# Patient Record
Sex: Female | Born: 1950 | Race: White | Hispanic: No | Marital: Married | Smoking: Former smoker
Health system: Southern US, Community
[De-identification: ages and names within clinical notes are randomized; demographics above are authoritative.]

## PROBLEM LIST (undated history)

## (undated) DIAGNOSIS — N816 Rectocele: Secondary | ICD-10-CM

## (undated) DIAGNOSIS — K5909 Other constipation: Secondary | ICD-10-CM

## (undated) DIAGNOSIS — K219 Gastro-esophageal reflux disease without esophagitis: Secondary | ICD-10-CM

## (undated) DIAGNOSIS — R7303 Prediabetes: Secondary | ICD-10-CM

## (undated) DIAGNOSIS — A048 Other specified bacterial intestinal infections: Secondary | ICD-10-CM

## (undated) HISTORY — PX: STRABISMUS SURGERY: SHX218

## (undated) HISTORY — PX: CATARACT EXTRACTION, BILATERAL: SHX1313

## (undated) HISTORY — DX: Other specified bacterial intestinal infections: A04.8

---

## 1999-02-15 HISTORY — PX: TOTAL ABDOMINAL HYSTERECTOMY W/ BILATERAL SALPINGOOPHORECTOMY: SHX83

## 2003-03-25 HISTORY — PX: CYSTOCELE REPAIR: SHX163

## 2011-08-01 ENCOUNTER — Other Ambulatory Visit: Payer: Self-pay | Admitting: Specialist

## 2011-08-01 DIAGNOSIS — M8580 Other specified disorders of bone density and structure, unspecified site: Secondary | ICD-10-CM

## 2011-08-01 DIAGNOSIS — Z1231 Encounter for screening mammogram for malignant neoplasm of breast: Secondary | ICD-10-CM

## 2011-10-17 ENCOUNTER — Ambulatory Visit
Admission: RE | Admit: 2011-10-17 | Discharge: 2011-10-17 | Disposition: A | Discharge status: Home / Self Care | Payer: BC Managed Care – PPO | Source: Ambulatory Visit | Attending: Specialist | Admitting: Specialist

## 2011-10-17 DIAGNOSIS — Z1231 Encounter for screening mammogram for malignant neoplasm of breast: Secondary | ICD-10-CM

## 2011-10-17 DIAGNOSIS — M8580 Other specified disorders of bone density and structure, unspecified site: Secondary | ICD-10-CM

## 2013-10-14 DIAGNOSIS — Z8619 Personal history of other infectious and parasitic diseases: Secondary | ICD-10-CM

## 2013-10-14 HISTORY — DX: Personal history of other infectious and parasitic diseases: Z86.19

## 2013-10-14 HISTORY — PX: ESOPHAGOGASTRODUODENOSCOPY: SHX1529

## 2013-12-02 IMAGING — MG MM DIGITAL SCREENING BILAT W/ CAD
4 series · 4 of 4 positions shown · non-contrast
Comparison: none

CLINICAL DATA: Screening.

MAMMOGRAPHIC BILATERAL DIGITAL SCREENING WITH CAD

[R CC]
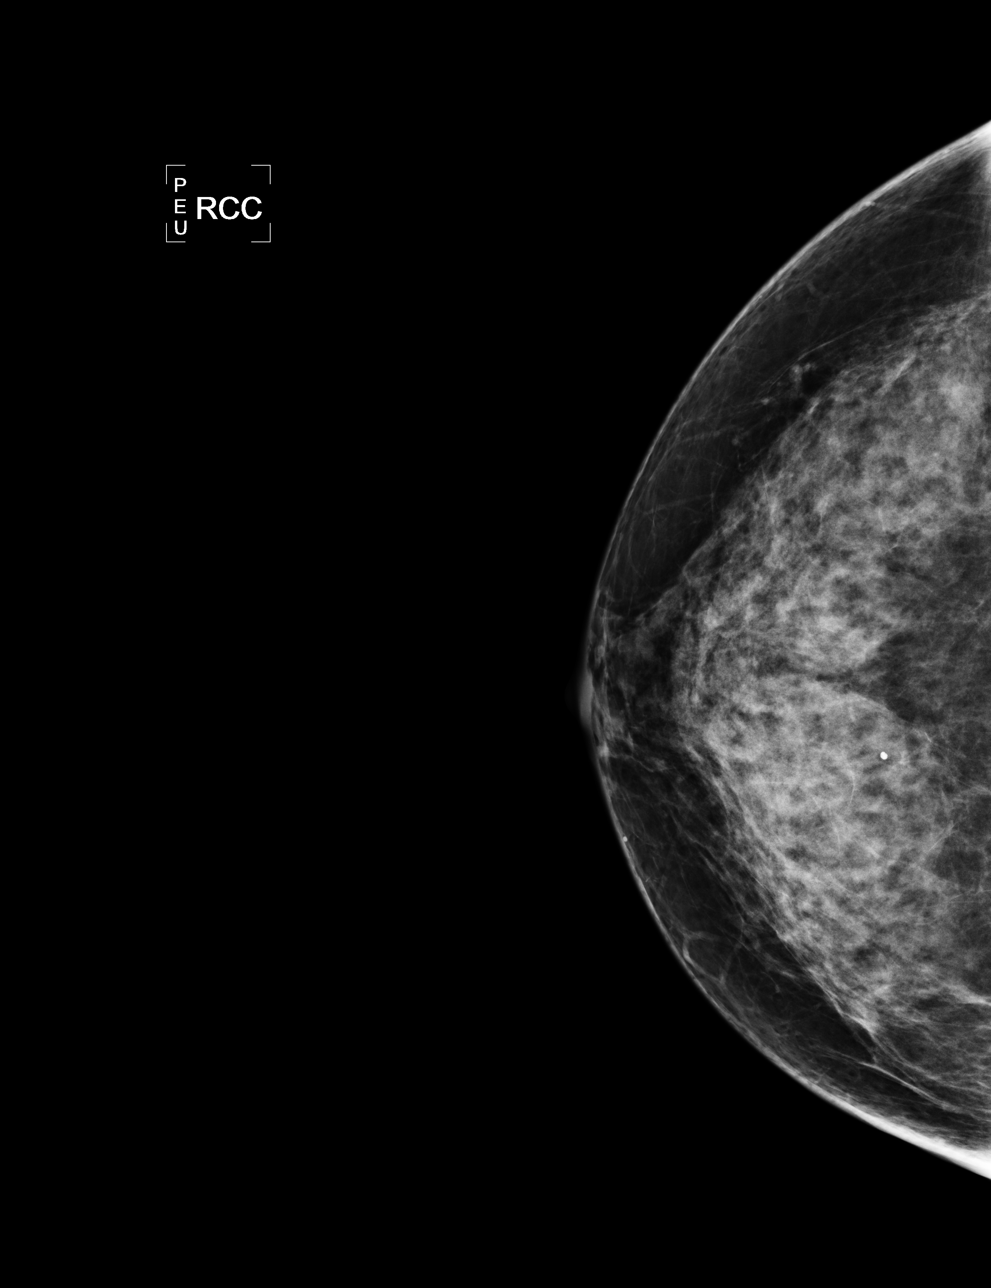

[L CC]
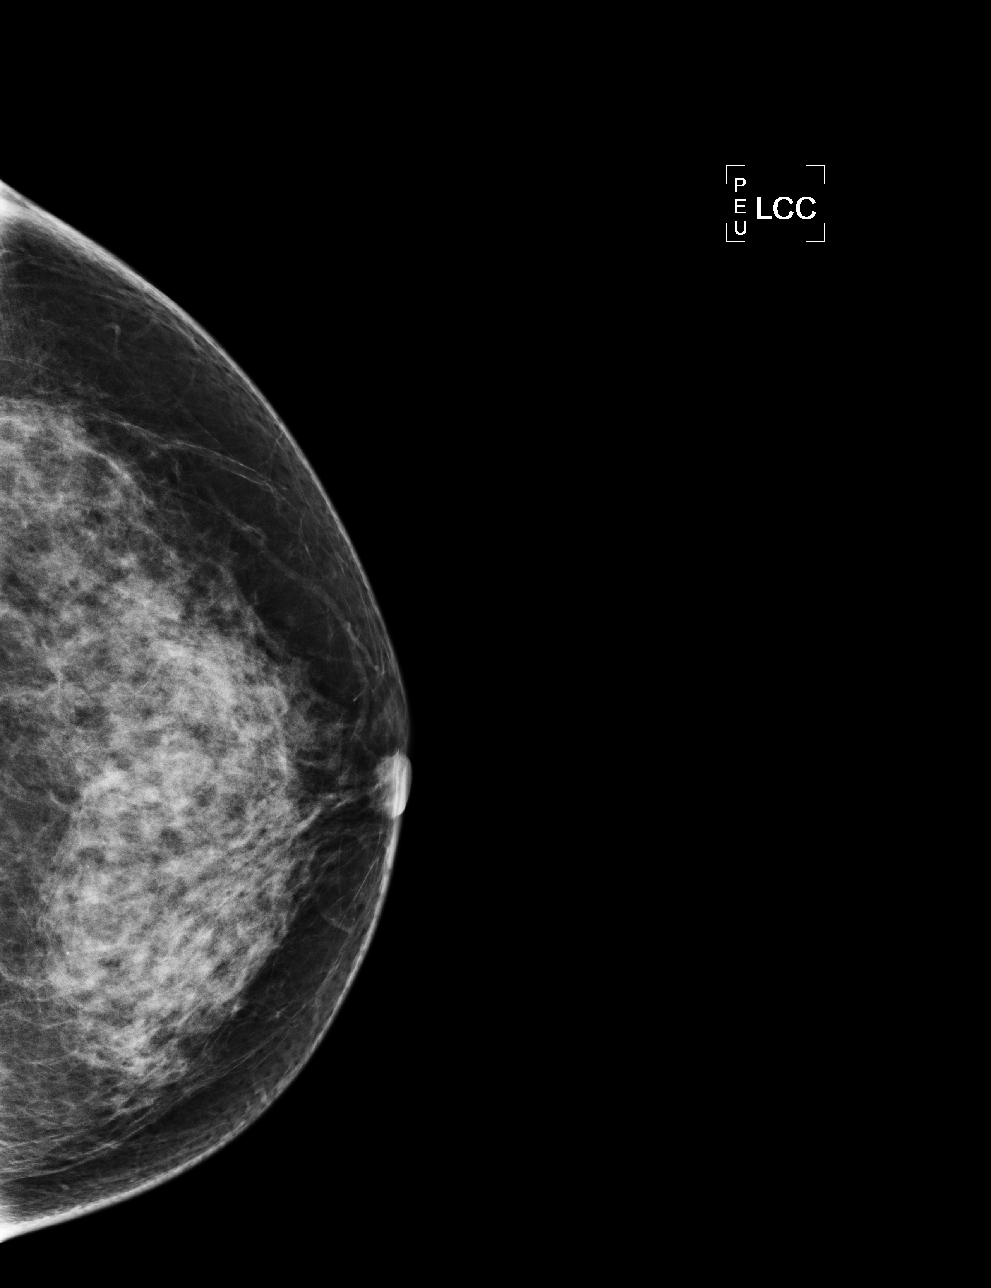

[L MLO]
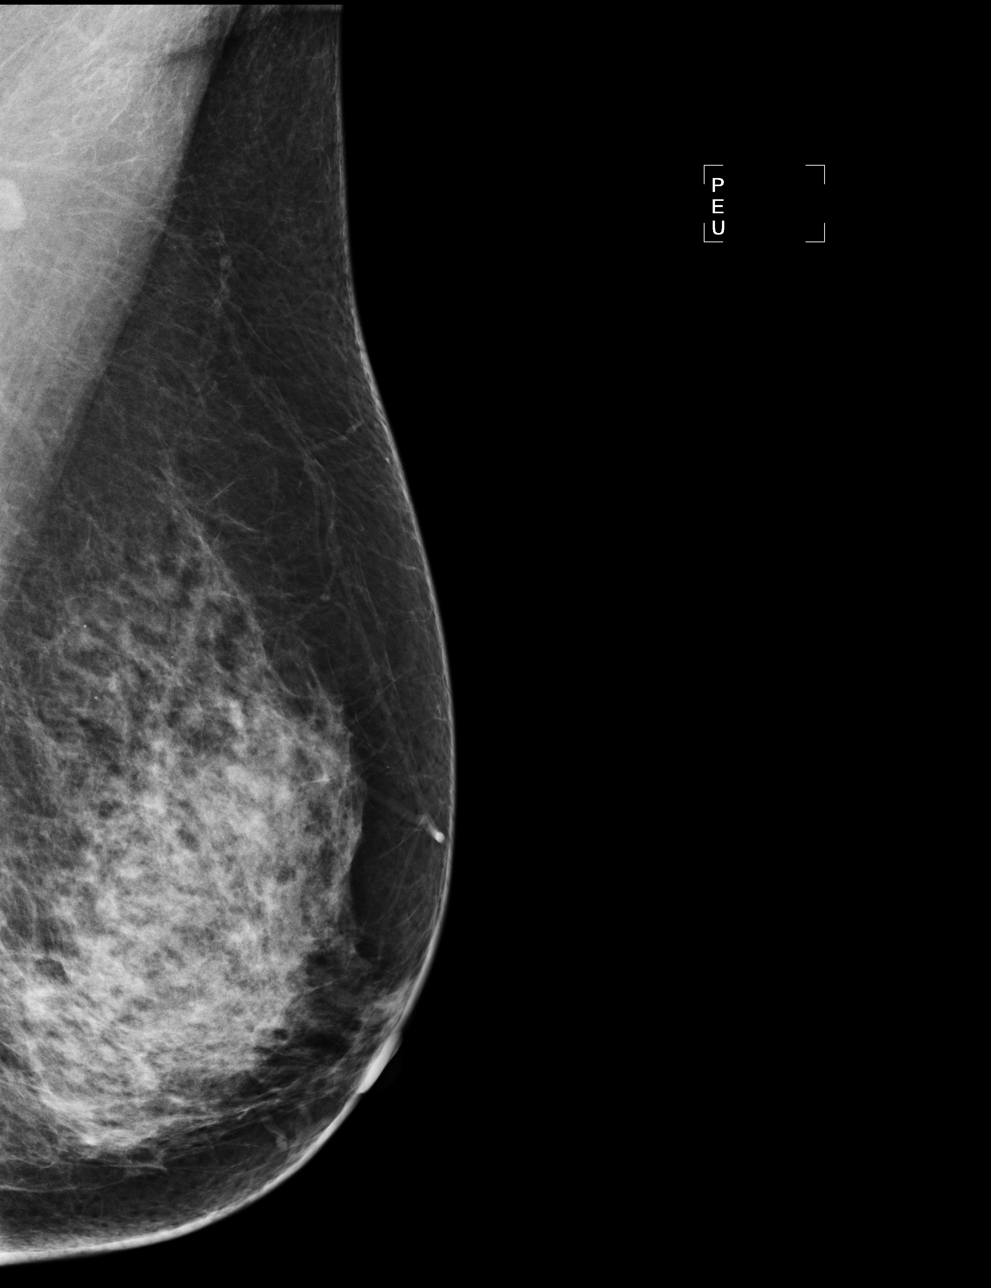

[R MLO]
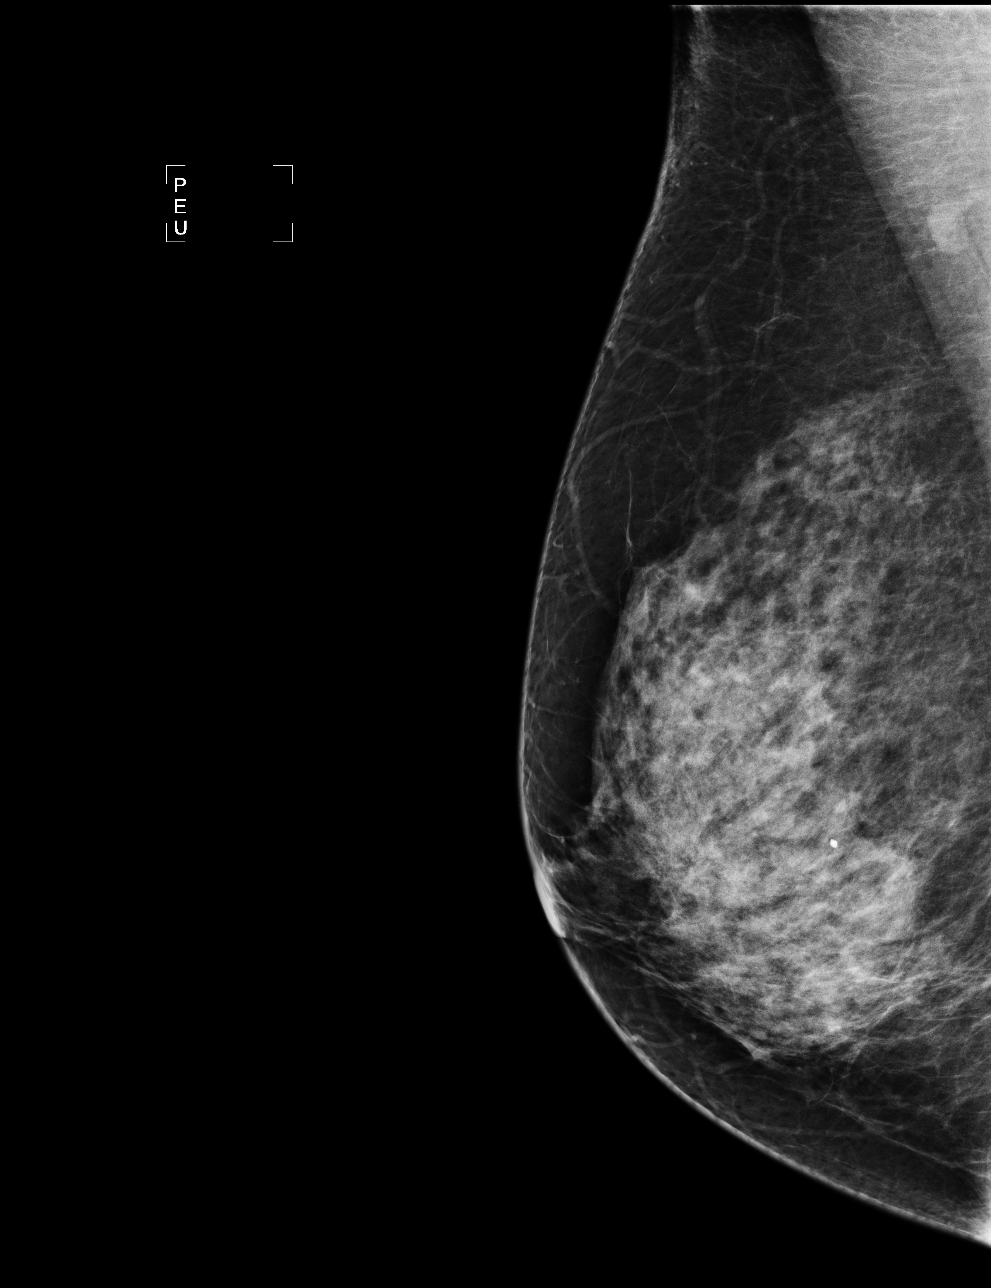

[4 of 4 positions shown; findings below may reference images not displayed]

FINDINGS: The breast tissue is heterogeneously dense.  No masses or
malignant type calcifications are identified.  Compared with prior
studies.

Images were processed with CAD.
IMPRESSION: No specific mammographic evidence of malignancy.

A result letter of this screening mammogram will be mailed directly
to the patient.

RECOMMENDATION:
Screening mammogram in one year. (Code:6P-7-BUE)

BI-RADS CATEGORY 1:  Negative

## 2015-05-17 HISTORY — PX: CATARACT EXTRACTION W/ INTRAOCULAR LENS IMPLANT: SHX1309

## 2016-02-15 ENCOUNTER — Encounter (HOSPITAL_BASED_OUTPATIENT_CLINIC_OR_DEPARTMENT_OTHER): Payer: Self-pay | Admitting: *Deleted

## 2016-02-16 ENCOUNTER — Ambulatory Visit: Payer: Self-pay | Admitting: Ophthalmology

## 2016-02-16 NOTE — H&P (Signed)
Date of examination:  02-11-16  Indication for surgery: to straighten the eyes and allow some binocularity  Pertinent past medical history:  Past Medical History:  Diagnosis Date  . GERD (gastroesophageal reflux disease)     Pertinent ocular history:  S/p strabismus surgery x 3:  Initially for esotropia, then for recurrent esotropia, then in 1989 RLR recess 6 mm adjustable for consecutive XT  Pertinent family history: No family history on file.  General:  Healthy appearing patient in no distress.    Eyes:    Acuity [place]  OD 20/30  OS 20/20-  External: Within normal limits     Anterior segment: healed conj scars OD   Motility:   XT to 30, sl larger in L gaze.  XT'=30.  RHoT 4 in primary, increasing in upgaze.  2- lim of elevation OD and 2- lim of adduction OD  Fundus: deferred  Refraction: SE plano OU approx   Heart: Regular rate and rhythm without murmur     Lungs: Clear to auscultation     Abdomen: Soft, nontender, normal bowel sounds     Impression:Exotropia, recurrent consecutive  Plan: 1) Explore and advance right medial rectus muscle  2) Explore and re-recess right lateral rectus muscle, adjustable  YOUNG,WILLIAM O

## 2016-02-19 ENCOUNTER — Ambulatory Visit (HOSPITAL_BASED_OUTPATIENT_CLINIC_OR_DEPARTMENT_OTHER): Payer: Medicare Other | Admitting: Certified Registered"

## 2016-02-19 ENCOUNTER — Encounter (HOSPITAL_BASED_OUTPATIENT_CLINIC_OR_DEPARTMENT_OTHER)
Admission: RE | Disposition: A | Discharge status: Home / Self Care | Payer: Self-pay | Source: Ambulatory Visit | Attending: Ophthalmology

## 2016-02-19 ENCOUNTER — Encounter (HOSPITAL_BASED_OUTPATIENT_CLINIC_OR_DEPARTMENT_OTHER): Payer: Self-pay | Admitting: *Deleted

## 2016-02-19 ENCOUNTER — Ambulatory Visit (HOSPITAL_BASED_OUTPATIENT_CLINIC_OR_DEPARTMENT_OTHER)
Admission: RE | Admit: 2016-02-19 | Discharge: 2016-02-19 | Disposition: A | Discharge status: Home / Self Care | Payer: Medicare Other | Source: Ambulatory Visit | Attending: Ophthalmology | Admitting: Ophthalmology

## 2016-02-19 DIAGNOSIS — K219 Gastro-esophageal reflux disease without esophagitis: Secondary | ICD-10-CM | POA: Diagnosis not present

## 2016-02-19 DIAGNOSIS — H5010 Unspecified exotropia: Secondary | ICD-10-CM | POA: Diagnosis not present

## 2016-02-19 HISTORY — PX: ADJUSTABLE SUTURE MANIPULATION: SHX5290

## 2016-02-19 HISTORY — DX: Gastro-esophageal reflux disease without esophagitis: K21.9

## 2016-02-19 HISTORY — PX: STRABISMUS SURGERY: SHX218

## 2016-02-19 SURGERY — REPAIR STRABISMUS
Anesthesia: General | Site: Eye | Laterality: Right

## 2016-02-19 MED ORDER — FENTANYL CITRATE (PF) 100 MCG/2ML IJ SOLN
INTRAMUSCULAR | Status: AC
  Filled 2016-02-19: qty 2

## 2016-02-19 MED ORDER — ATROPINE SULFATE 0.4 MG/ML IJ SOLN
INTRAMUSCULAR | Status: AC
  Filled 2016-02-19: qty 1

## 2016-02-19 MED ORDER — FENTANYL CITRATE (PF) 100 MCG/2ML IJ SOLN
INTRAMUSCULAR | Status: DC | PRN
  Administered 2016-02-19: 25 ug via INTRAVENOUS
  Administered 2016-02-19: 75 ug via INTRAVENOUS
  Administered 2016-02-19: 25 ug via INTRAVENOUS

## 2016-02-19 MED ORDER — PROMETHAZINE HCL 25 MG/ML IJ SOLN: 6.2500 mg | INTRAMUSCULAR | Status: DC | PRN

## 2016-02-19 MED ORDER — LIDOCAINE 2% (20 MG/ML) 5 ML SYRINGE
INTRAMUSCULAR | Status: DC | PRN
  Administered 2016-02-19: 70 mg via INTRAVENOUS

## 2016-02-19 MED ORDER — MIDAZOLAM HCL 2 MG/2ML IJ SOLN
INTRAMUSCULAR | Status: AC
  Filled 2016-02-19: qty 2

## 2016-02-19 MED ORDER — OXYCODONE HCL 5 MG/5ML PO SOLN: 5.0000 mg | Freq: Once | ORAL | Status: DC | PRN

## 2016-02-19 MED ORDER — KETOROLAC TROMETHAMINE 30 MG/ML IJ SOLN
INTRAMUSCULAR | Status: AC
  Filled 2016-02-19: qty 1

## 2016-02-19 MED ORDER — BSS IO SOLN
INTRAOCULAR | Status: DC | PRN
  Administered 2016-02-19: 15 mL

## 2016-02-19 MED ORDER — BSS IO SOLN
INTRAOCULAR | Status: AC
  Filled 2016-02-19: qty 15

## 2016-02-19 MED ORDER — LIDOCAINE 2% (20 MG/ML) 5 ML SYRINGE
INTRAMUSCULAR | Status: AC
  Filled 2016-02-19: qty 5

## 2016-02-19 MED ORDER — TOBRAMYCIN-DEXAMETHASONE 0.3-0.1 % OP OINT
TOPICAL_OINTMENT | OPHTHALMIC | Status: AC
  Filled 2016-02-19: qty 3.5

## 2016-02-19 MED ORDER — GLYCOPYRROLATE 0.2 MG/ML IJ SOLN
0.2000 mg | Freq: Once | INTRAMUSCULAR | Status: AC | PRN
  Administered 2016-02-19: 0.2 mg via INTRAVENOUS

## 2016-02-19 MED ORDER — KETOROLAC TROMETHAMINE 30 MG/ML IJ SOLN
30.0000 mg | Freq: Once | INTRAMUSCULAR | Status: AC | PRN
  Administered 2016-02-19: 30 mg via INTRAVENOUS

## 2016-02-19 MED ORDER — TOBRAMYCIN-DEXAMETHASONE 0.3-0.1 % OP OINT: 1.0000 "application " | TOPICAL_OINTMENT | Freq: Two times a day (BID) | OPHTHALMIC | 0 refills | Status: DC

## 2016-02-19 MED ORDER — LACTATED RINGERS IV SOLN
INTRAVENOUS | Status: DC
  Administered 2016-02-19 (×2): via INTRAVENOUS

## 2016-02-19 MED ORDER — DEXAMETHASONE SODIUM PHOSPHATE 10 MG/ML IJ SOLN
INTRAMUSCULAR | Status: AC
  Filled 2016-02-19: qty 1

## 2016-02-19 MED ORDER — OXYCODONE HCL 5 MG PO TABS: 5.0000 mg | ORAL_TABLET | Freq: Once | ORAL | Status: DC | PRN

## 2016-02-19 MED ORDER — MIDAZOLAM HCL 5 MG/5ML IJ SOLN
INTRAMUSCULAR | Status: DC | PRN
  Administered 2016-02-19: 1 mg via INTRAVENOUS

## 2016-02-19 MED ORDER — TOBRAMYCIN-DEXAMETHASONE 0.3-0.1 % OP OINT
TOPICAL_OINTMENT | OPHTHALMIC | Status: DC | PRN
  Administered 2016-02-19: 1

## 2016-02-19 MED ORDER — PROPOFOL 10 MG/ML IV BOLUS
INTRAVENOUS | Status: DC | PRN
  Administered 2016-02-19: 200 mg via INTRAVENOUS

## 2016-02-19 MED ORDER — FENTANYL CITRATE (PF) 100 MCG/2ML IJ SOLN
25.0000 ug | INTRAMUSCULAR | Status: DC | PRN
  Administered 2016-02-19: 50 ug via INTRAVENOUS

## 2016-02-19 MED ORDER — DEXAMETHASONE SODIUM PHOSPHATE 4 MG/ML IJ SOLN
INTRAMUSCULAR | Status: DC | PRN
  Administered 2016-02-19: 10 mg via INTRAVENOUS

## 2016-02-19 MED ORDER — ONDANSETRON HCL 4 MG/2ML IJ SOLN
INTRAMUSCULAR | Status: DC | PRN
  Administered 2016-02-19: 4 mg via INTRAVENOUS

## 2016-02-19 MED ORDER — PROPOFOL 10 MG/ML IV BOLUS
INTRAVENOUS | Status: AC
  Filled 2016-02-19: qty 20

## 2016-02-19 MED ORDER — ONDANSETRON HCL 4 MG/2ML IJ SOLN
INTRAMUSCULAR | Status: AC
  Filled 2016-02-19: qty 2

## 2016-02-19 SURGICAL SUPPLY — 30 items
APPLICATOR COTTON TIP 6IN STRL (MISCELLANEOUS) ×12 IMPLANT
APPLICATOR DR MATTHEWS STRL (MISCELLANEOUS) ×3 IMPLANT
BANDAGE EYE OVAL (MISCELLANEOUS) IMPLANT
CAUTERY EYE LOW TEMP 1300F FIN (OPHTHALMIC RELATED) IMPLANT
COVER BACK TABLE 60X90IN (DRAPES) ×3 IMPLANT
COVER MAYO STAND STRL (DRAPES) ×3 IMPLANT
DRAPE SURG 17X23 STRL (DRAPES) ×6 IMPLANT
DRAPE U-SHAPE 76X120 STRL (DRAPES) ×3 IMPLANT
GLOVE BIO SURGEON STRL SZ 6.5 (GLOVE) ×9 IMPLANT
GLOVE BIOGEL M STRL SZ7.5 (GLOVE) ×3 IMPLANT
GOWN STRL REUS W/ TWL LRG LVL3 (GOWN DISPOSABLE) ×2 IMPLANT
GOWN STRL REUS W/TWL LRG LVL3 (GOWN DISPOSABLE) ×1
GOWN STRL REUS W/TWL XL LVL3 (GOWN DISPOSABLE) ×6 IMPLANT
NS IRRIG 1000ML POUR BTL (IV SOLUTION) ×3 IMPLANT
PACK BASIN DAY SURGERY FS (CUSTOM PROCEDURE TRAY) ×3 IMPLANT
SHEET MEDIUM DRAPE 40X70 STRL (DRAPES) IMPLANT
SLEEVE SCD COMPRESS KNEE MED (MISCELLANEOUS) ×3 IMPLANT
SPEAR EYE SURG WECK-CEL (MISCELLANEOUS) ×12 IMPLANT
STRIP CLOSURE SKIN 1/4X4 (GAUZE/BANDAGES/DRESSINGS) ×3 IMPLANT
SUT 6 0 SILK T G140 8DA (SUTURE) ×3 IMPLANT
SUT MERSILENE 6-0 18IN S14 8MM (SUTURE)
SUT PLAIN 6 0 TG1408 (SUTURE) ×3 IMPLANT
SUT SILK 4 0 C 3 735G (SUTURE) IMPLANT
SUT VICRYL 6 0 S 28 (SUTURE) IMPLANT
SUT VICRYL ABS 6-0 S29 18IN (SUTURE) ×3 IMPLANT
SUTURE MERSLN 6-0 18IN S14 8MM (SUTURE) IMPLANT
SYR TB 1ML LL NO SAFETY (SYRINGE) ×3 IMPLANT
SYRINGE 10CC LL (SYRINGE) ×3 IMPLANT
TOWEL OR 17X24 6PK STRL BLUE (TOWEL DISPOSABLE) ×3 IMPLANT
TRAY DSU PREP LF (CUSTOM PROCEDURE TRAY) ×3 IMPLANT

## 2016-02-19 NOTE — Discharge Instructions (Signed)
Diet: Clear liquids, advance to soft foods then regular diet as tolerated by this evening.  Pain control:   1)  Ibuprofen 600 mg by mouth every 6-8 hours as needed for pain  2)  Ice pack/cold compress to operated eye(s) as desired  Eye medications:    Tobradex or Zylet eye ointment 1/2 inch in operated eye(s) twice a day   Activity: No swimming for 1 week.  It is OK to let water run over the face and eyes while showering or taking a bath, even during the first week.  No other restriction on exercise or activity.  If there is a patch on one eye, leave it in place until seen in Dr. Janee Morn office this afternoon for suture adjustment.  If there is no patch, you do not need to come to the office this afternoon; just come for your scheduled postop appointment.  Call Dr. Janee Morn office [phone] with any problems or concerns.    Post Anesthesia Home Care Instructions  Activity: Get plenty of rest for the remainder of the day. A responsible adult should stay with you for 24 hours following the procedure.  For the next 24 hours, DO NOT: -Drive a car -Paediatric nurse -Drink alcoholic beverages -Take any medication unless instructed by your physician -Make any legal decisions or sign important papers.  Meals: Start with liquid foods such as gelatin or soup. Progress to regular foods as tolerated. Avoid greasy, spicy, heavy foods. If nausea and/or vomiting occur, drink only clear liquids until the nausea and/or vomiting subsides. Call your physician if vomiting continues.  Special Instructions/Symptoms: Your throat may feel dry or sore from the anesthesia or the breathing tube placed in your throat during surgery. If this causes discomfort, gargle with warm salt water. The discomfort should disappear within 24 hours.  If you had a scopolamine patch placed behind your ear for the management of post- operative nausea and/or vomiting:  1. The medication in the patch is effective for 72  hours, after which it should be removed.  Wrap patch in a tissue and discard in the trash. Wash hands thoroughly with soap and water. 2. You may remove the patch earlier than 72 hours if you experience unpleasant side effects which may include dry mouth, dizziness or visual disturbances. 3. Avoid touching the patch. Wash your hands with soap and water after contact with the patch.

## 2016-02-19 NOTE — Anesthesia Procedure Notes (Signed)
Procedure Name: LMA Insertion Date/Time: 02/19/2016 12:06 PM Performed by: Baxter Flattery Pre-anesthesia Checklist: Patient identified, Emergency Drugs available, Suction available and Patient being monitored Patient Re-evaluated:Patient Re-evaluated prior to inductionOxygen Delivery Method: Circle system utilized Preoxygenation: Pre-oxygenation with 100% oxygen Intubation Type: IV induction Ventilation: Mask ventilation without difficulty LMA: LMA flexible inserted LMA Size: 4.0 Number of attempts: 1 Airway Equipment and Method: Bite block Placement Confirmation: positive ETCO2 and breath sounds checked- equal and bilateral Tube secured with: Tape Dental Injury: Teeth and Oropharynx as per pre-operative assessment

## 2016-02-19 NOTE — Op Note (Signed)
02/19/2016  1:36 PM  PATIENT:  Kim Mann    PRE-OPERATIVE DIAGNOSIS:  EXOTROPIA, consecutive  POST-OPERATIVE DIAGNOSIS:  same  PROCEDURE:  1.  Explore right medial and lateral rectus muscles  ` 2. Right lateral rectus muscle re-recession, 4.0 mm, adjustable   3.  Advance right medial rectus muscle, 4.0 mm  SURGEON:  [name], MD  ANESTHESIA:   General  COMPLICATIONS: none  OPERATIVE PROCEDURE: After routine preoperative evaluation including informed consent, the patient was taken to the operating room where she was identified by me. General anesthesia was induced without difficulty after placement of appropriate monitors. The patient was prepped and draped in standard sterile fashion. A lid speculum was placed in the right eye.  Forced ductions showed restriction to forced adduction of the right eye. An incision was made through conjunctiva over the right medial rectus muscle, approximately 10 mm posterior to the limbus. The previously recessed right medial rectus muscle was engaged on a series of muscle hooks and cleared of its surrounding fascial attachments and extensive scar tissue. The tendon was secured with a double-armed 6-0 Vicryl suture, with a locking bite at each border of the muscle, 1-2 mm from the insertion. Note that the muscle did not show any signs of having slipped, and I did not feel that there is a stretched scar.  Attention was directed to the right lateral rectus muscle. A conjunctival incision was made over the lateral rectus muscle, proximally 10 mm posterior to the limbus. The lateral rectus muscle, which at the patient's most recent surgery had been recessed 6 mm on adjustable suture, was engaged on a series of muscle hooks from the superior side, to avoid provoking the inferior oblique. The muscle was cleared of its surrounding fascial attachments and extensive scar tissue. Care was taken to separate the muscle from the inferior oblique. The muscle was  secured with a double-armed 6-0 Vicryl suture, with a locking bite at each border of the muscle, 1-2 mm from the insertion. The muscle was disinserted. Each pole suture was passed sclera at approximately the level of the original insertion, 7 mm posterior to the limbus, in crossed swords fashion. The muscle was drawn up to the level of the original insertion. With the muscle held at this position, the 2 pole sutures were tied together approximate 10 cm above sclera. The 2 pole sutures were then joined together with a needle driver at a measured distance of 10 mm above sclera. A noose knot of 6-0 Vicryl was used to join the tube pole sutures together at this location. The muscle was then allowed to hang back until the noose knot reached sclera, creating a 10 mm hang back recession of the lateral rectus muscle, which represents a 4 mm re-recession, given that the muscle had previously been recessed 6 mm. The conjunctival wound was very loosely closed with a large loop of 6-0 plain gut, leaving the incision open to facilitate suture adjustment.  Attention was then redirected to the medial rectus muscle, which was engaged on a series of muscle hooks. The muscle was disinserted. The current insertion was measured to be 13 mm posterior to the limbus. Each pole suture was passed back into sclera 4 mm anterior to the current location, using direct scleral passes in crossed swords fashion. The suture ends were tied securely, creating a 4 mm advancement of the previously recessed medial rectus muscle. Conjunctiva was apposed with a single 6-0 plain gut suture. A traction suture of 6-0 silk was  placed at the temporal limbus. The pole, noose, traction, and conjunctival sutures were taped to the cheek. Tobradex ointment was placed in the eye. A sterile patch was placed over the eye. The patient was awakened without difficulty and taken to the recovery room in stable condition, having suffered no intraoperative or immediate  postoperative complications.  [name], MD

## 2016-02-19 NOTE — Anesthesia Postprocedure Evaluation (Signed)
Anesthesia Post Note  Patient: Avina Coldren  Procedure(s) Performed: Procedure(s) (LRB): REPAIR STRABISMUS (Right) ADJUSTABLE SUTURE MANIPULATION (Right)  Patient location during evaluation: PACU Anesthesia Type: General Level of consciousness: awake and alert Pain management: pain level controlled Vital Signs Assessment: post-procedure vital signs reviewed and stable Respiratory status: spontaneous breathing, nonlabored ventilation, respiratory function stable and patient connected to nasal cannula oxygen Cardiovascular status: blood pressure returned to baseline and stable Postop Assessment: no signs of nausea or vomiting Anesthetic complications: no    Last Vitals:  Vitals:   02/19/16 1342 02/19/16 1343  BP: (!) 141/75   Pulse: 89 90  Resp: 20 13  Temp: 36.5 C     Last Pain:  Vitals:   02/19/16 1342  TempSrc:   PainSc: Asleep                 ROSE,GEORGE S

## 2016-02-19 NOTE — H&P (View-Only) (Signed)
Date of examination:  02-11-16  Indication for surgery: to straighten the eyes and allow some binocularity  Pertinent past medical history:  Past Medical History:  Diagnosis Date  . GERD (gastroesophageal reflux disease)     Pertinent ocular history:  S/p strabismus surgery x 3:  Initially for esotropia, then for recurrent esotropia, then in 1989 RLR recess 6 mm adjustable for consecutive XT  Pertinent family history: No family history on file.  General:  Healthy appearing patient in no distress.    Eyes:    Acuity [place]  OD 20/30  OS 20/20-  External: Within normal limits     Anterior segment: healed conj scars OD   Motility:   XT to 30, sl larger in L gaze.  XT'=30.  RHoT 4 in primary, increasing in upgaze.  2- lim of elevation OD and 2- lim of adduction OD  Fundus: deferred  Refraction: SE plano OU approx   Heart: Regular rate and rhythm without murmur     Lungs: Clear to auscultation     Abdomen: Soft, nontender, normal bowel sounds     Impression:Exotropia, recurrent consecutive  Plan: 1) Explore and advance right medial rectus muscle  2) Explore and re-recess right lateral rectus muscle, adjustable  YOUNG,WILLIAM O

## 2016-02-19 NOTE — Anesthesia Preprocedure Evaluation (Signed)
Anesthesia Evaluation  Patient identified by MRN, date of birth, ID band Patient awake    Reviewed: Allergy & Precautions, NPO status , Patient's Chart, lab work & pertinent test results  Airway Mallampati: II  TM Distance: >3 FB Neck ROM: Full    Dental no notable dental hx.    Pulmonary neg pulmonary ROS,    Pulmonary exam normal breath sounds clear to auscultation       Cardiovascular negative cardio ROS Normal cardiovascular exam Rhythm:Regular Rate:Normal     Neuro/Psych negative neurological ROS  negative psych ROS   GI/Hepatic Neg liver ROS, GERD  Medicated,  Endo/Other  negative endocrine ROS  Renal/GU negative Renal ROS  negative genitourinary   Musculoskeletal negative musculoskeletal ROS (+)   Abdominal   Peds negative pediatric ROS (+)  Hematology negative hematology ROS (+)   Anesthesia Other Findings   Reproductive/Obstetrics negative OB ROS                             Anesthesia Physical Anesthesia Plan  ASA: II  Anesthesia Plan: General   Post-op Pain Management:    Induction: Intravenous  Airway Management Planned: LMA  Additional Equipment:   Intra-op Plan:   Post-operative Plan: Extubation in OR  Informed Consent: I have reviewed the patients History and Physical, chart, labs and discussed the procedure including the risks, benefits and alternatives for the proposed anesthesia with the patient or authorized representative who has indicated his/her understanding and acceptance.   Dental advisory given  Plan Discussed with: CRNA and Surgeon  Anesthesia Plan Comments:         Anesthesia Quick Evaluation

## 2016-02-19 NOTE — Transfer of Care (Signed)
Immediate Anesthesia Transfer of Care Note  Patient: Kim Mann  Procedure(s) Performed: Procedure(s): REPAIR STRABISMUS (Right) ADJUSTABLE SUTURE MANIPULATION (Right)  Patient Location: PACU  Anesthesia Type:General  Level of Consciousness: awake, alert  and patient cooperative  Airway & Oxygen Therapy: Patient Spontanous Breathing and Patient connected to face mask oxygen  Post-op Assessment: Report given to RN, Post -op Vital signs reviewed and stable and Patient moving all extremities  Post vital signs: Reviewed and stable  Last Vitals:  Vitals:   02/19/16 0928  BP: (!) 141/62  Pulse: 71  Resp: 18  Temp: 36.6 C    Last Pain:  Vitals:   02/19/16 0928  TempSrc: Oral         Complications: No apparent anesthesia complications

## 2016-02-19 NOTE — Interval H&P Note (Signed)
History and Physical Interval Note:  02/19/2016 11:40 AM  Kim Mann  has presented today for surgery, with the diagnosis of EXOTROPIA  The various methods of treatment have been discussed with the patient and family. After consideration of risks, benefits and other options for treatment, the patient has consented to  Strabismus surgery right eye with possible adjustable suture as a surgical intervention .  The patient's history has been reviewed, patient examined, no change in status, stable for surgery.  I have reviewed the patient's chart and labs.  Questions were answered to the patient's satisfaction.     [name]

## 2016-02-22 ENCOUNTER — Encounter (HOSPITAL_BASED_OUTPATIENT_CLINIC_OR_DEPARTMENT_OTHER): Payer: Self-pay | Admitting: Ophthalmology

## 2016-11-22 DIAGNOSIS — M722 Plantar fascial fibromatosis: Secondary | ICD-10-CM | POA: Insufficient documentation

## 2016-11-22 DIAGNOSIS — M7730 Calcaneal spur, unspecified foot: Secondary | ICD-10-CM | POA: Insufficient documentation

## 2016-11-22 DIAGNOSIS — M79673 Pain in unspecified foot: Secondary | ICD-10-CM | POA: Insufficient documentation

## 2016-11-22 DIAGNOSIS — M2010 Hallux valgus (acquired), unspecified foot: Secondary | ICD-10-CM | POA: Insufficient documentation

## 2017-01-23 ENCOUNTER — Encounter: Payer: Self-pay | Admitting: Internal Medicine

## 2017-03-08 ENCOUNTER — Ambulatory Visit: Payer: Medicare Other | Admitting: Nurse Practitioner

## 2017-03-09 ENCOUNTER — Ambulatory Visit (INDEPENDENT_AMBULATORY_CARE_PROVIDER_SITE_OTHER): Payer: Medicare Other | Admitting: Gastroenterology

## 2017-03-09 ENCOUNTER — Other Ambulatory Visit: Payer: Self-pay

## 2017-03-09 ENCOUNTER — Encounter: Payer: Self-pay | Admitting: Gastroenterology

## 2017-03-09 DIAGNOSIS — D126 Benign neoplasm of colon, unspecified: Secondary | ICD-10-CM | POA: Diagnosis not present

## 2017-03-09 DIAGNOSIS — Z86010 Personal history of colonic polyps: Secondary | ICD-10-CM

## 2017-03-09 NOTE — Patient Instructions (Signed)
COMPLETE COLONOSCOPY IN NOV OR DEC 2018.  FOLLOW  Clear liquid diet on day before colonoscopy. If you are hungry you can have one serving of mashed potatoes and scrambled eggs.  FOLLOW UP IF NEEDED AFTER ENDOSCOPY.  YOUR SISTERS, BROTHERS, CHILDREN, AND PARENTS NEED TO HAVE A COLONOSCOPY STARTING AT THE AGE OF 40.

## 2017-03-09 NOTE — H&P (View-Only) (Signed)
   Subjective:    Patient ID: Kim Mann, female    DOB: 1951-03-04, 66 y.o.   MRN: [id number]  Danie Binder, MD  HPI Last TCS 2012 SERRATED ADENOMA REMOVED. HAD RLQ DISCOMFORTOFF AND ON AND EXACERBATED BY CONSTIPATION. ALSO HAD HEMORRHOID FLARES. NO PROBLEM WITH RLQ UNLESS SHE HAS CONSTIPATION. DRINKS PLENTY OF WATER AND EATS FIBER. PRILOSEC OTC CONTROLS HEARTBURN.  PT DENIES FEVER, CHILLS, HEMATOCHEZIA, nausea, vomiting, melena, diarrhea, CHEST PAIN, SHORTNESS OF BREATH, CHANGE IN BOWEL IN HABITS,  problems swallowing, problems with sedation, OR heartburn or indigestion.  Past Medical History:  Diagnosis Date  . GERD (gastroesophageal reflux disease)    Past Surgical History:  Procedure Laterality Date  . ADJUSTABLE SUTURE MANIPULATION Right 02/19/2016   Procedure: ADJUSTABLE SUTURE MANIPULATION;  Surgeon: Everitt Amber, MD;  Location: [facility];  Service: Ophthalmology;  Laterality: Right;  . CATARACT EXTRACTION, BILATERAL    . STRABISMUS SURGERY Right 02/19/2016   Procedure: REPAIR STRABISMUS;  Surgeon: Everitt Amber, MD;  Location: [facility];  Service: Ophthalmology;  Laterality: Right;   Allergies  Allergen Reactions  . Sulfa Antibiotics Rash   Current Outpatient Prescriptions  Medication Sig Dispense Refill  . Omega-3 Fatty Acids (FISH OIL) 1200 MG CAPS Take by mouth daily.    Marland Kitchen omeprazole (PRILOSEC) 20 MG capsule Take 20 mg by mouth daily.    . Probiotic Product (PRO-BIOTIC BLEND) CAPS Take by mouth.    .       Family History  Problem Relation Age of Onset  . Brain cancer Mother 53  . Heart failure Father 58  . Colon cancer Neg Hx   . Colon polyps Neg Hx    Social History   Social History  . Marital status: Married: FIRST Photographer, 2nd: 60-38 YRS    Spouse name: DENNIS  . Number of children: TWO  . Years of education: [employer] IN SW/MINOR IN EDUCATION   Social History Main Topics  . Smoking status: Never Smoker  .  Smokeless tobacco: Never Used  . Alcohol use No  . Drug use: No  . Sexual activity: Not Asked   Social History Narrative   2 KIDS: AGE 43 AND 45. 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   Review of Systems PER HPI OTHERWISE ALL SYSTEMS ARE NEGATIVE.    Objective:   Physical Exam  Constitutional: She is oriented to person, place, and time. She appears well-developed and well-nourished. No distress.  HENT:  Head: Normocephalic and atraumatic.  Mouth/Throat: Oropharynx is clear and moist. No oropharyngeal exudate.  Eyes: Pupils are equal, round, and reactive to light. No scleral icterus.  Neck: Normal range of motion. Neck supple.  Cardiovascular: Normal rate, regular rhythm and normal heart sounds.   Pulmonary/Chest: Effort normal and breath sounds normal. No respiratory distress.  Abdominal: Soft. Bowel sounds are normal. She exhibits no distension. There is no tenderness.  Musculoskeletal: She exhibits no edema.  Lymphadenopathy:    She has no cervical adenopathy.  Neurological: She is alert and oriented to person, place, and time.  Psychiatric: She has a normal mood and affect.  Vitals reviewed.     Assessment & Plan:

## 2017-03-09 NOTE — Assessment & Plan Note (Signed)
ON COLONOSCOPY IN 2012. NO WARNING SIGNS/SYMPTOMS  COMPLETE COLONOSCOPY IN NOV OR DEC 2018. ZOFRAN 4 MG IV IN PREOP. DISCUSSED PROCEDURE, BENEFITS, & RISKS: < 1% chance of medication reaction, bleeding, perforation, or rupture of spleen/liver.  FOLLOW  Clear liquid diet on day before colonoscopy. If you are hungry you can have one serving of mashed potatoes and scrambled eggs. FOLLOW UP IF NEEDED AFTER ENDOSCOPY.  YOUR SISTERS, BROTHERS, CHILDREN, AND PARENTS NEED TO HAVE A COLONOSCOPY STARTING AT THE AGE OF 40

## 2017-03-09 NOTE — Progress Notes (Signed)
   Subjective:    Patient ID: Kim Mann, female    DOB: Aug 29, 1950, 66 y.o.   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 HAD RLQ DISCOMFORTOFF AND ON AND EXACERBATED BY CONSTIPATION. ALSO HAD HEMORRHOID FLARES. NO PROBLEM WITH RLQ UNLESS SHE HAS CONSTIPATION. DRINKS PLENTY OF WATER AND EATS FIBER. PRILOSEC OTC CONTROLS HEARTBURN.  PT DENIES FEVER, CHILLS, HEMATOCHEZIA, nausea, vomiting, melena, diarrhea, CHEST PAIN, SHORTNESS OF BREATH, CHANGE IN BOWEL IN HABITS,  problems swallowing, problems with sedation, OR heartburn or indigestion.  Past Medical History:  Diagnosis Date  . GERD (gastroesophageal reflux disease)    Past Surgical History:  Procedure Laterality Date  . ADJUSTABLE SUTURE MANIPULATION Right 02/19/2016   Procedure: ADJUSTABLE SUTURE MANIPULATION;  Surgeon: Everitt Amber, MD;  Location: [facility];  Service: Ophthalmology;  Laterality: Right;  . CATARACT EXTRACTION, BILATERAL    . STRABISMUS SURGERY Right 02/19/2016   Procedure: REPAIR STRABISMUS;  Surgeon: Everitt Amber, MD;  Location: [facility];  Service: Ophthalmology;  Laterality: Right;   Allergies  Allergen Reactions  . Sulfa Antibiotics Rash   Current Outpatient Prescriptions  Medication Sig Dispense Refill  . Omega-3 Fatty Acids (FISH OIL) 1200 MG CAPS Take by mouth daily.    Marland Kitchen omeprazole (PRILOSEC) 20 MG capsule Take 20 mg by mouth daily.    . Probiotic Product (PRO-BIOTIC BLEND) CAPS Take by mouth.    .       Family History  Problem Relation Age of Onset  . Brain cancer Mother 61  . Heart failure Father 37  . Colon cancer Neg Hx   . Colon polyps Neg Hx    Social History   Social History  . Marital status: Married: FIRST Photographer, 2nd: 72-38 YRS    Spouse name: DENNIS  . Number of children: TWO  . Years of education: [employer] IN SW/MINOR IN EDUCATION   Social History Main Topics  . Smoking status: Never Smoker  .  Smokeless tobacco: Never Used  . Alcohol use No  . Drug use: No  . Sexual activity: Not Asked   Social History Narrative   2 KIDS: AGE 26 AND 58. 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   Review of Systems PER HPI OTHERWISE ALL SYSTEMS ARE NEGATIVE.    Objective:   Physical Exam  Constitutional: She is oriented to person, place, and time. She appears well-developed and well-nourished. No distress.  HENT:  Head: Normocephalic and atraumatic.  Mouth/Throat: Oropharynx is clear and moist. No oropharyngeal exudate.  Eyes: Pupils are equal, round, and reactive to light. No scleral icterus.  Neck: Normal range of motion. Neck supple.  Cardiovascular: Normal rate, regular rhythm and normal heart sounds.   Pulmonary/Chest: Effort normal and breath sounds normal. No respiratory distress.  Abdominal: Soft. Bowel sounds are normal. She exhibits no distension. There is no tenderness.  Musculoskeletal: She exhibits no edema.  Lymphadenopathy:    She has no cervical adenopathy.  Neurological: She is alert and oriented to person, place, and time.  Psychiatric: She has a normal mood and affect.  Vitals reviewed.     Assessment & Plan:

## 2017-03-10 NOTE — Progress Notes (Signed)
CC'ED TO PCP 

## 2017-03-31 ENCOUNTER — Telehealth: Payer: Self-pay

## 2017-03-31 NOTE — Telephone Encounter (Signed)
Called pt. TCS time changed to 10:30am 04/04/17. She will arrive at 9:30am. Endo scheduler aware.

## 2017-04-04 ENCOUNTER — Ambulatory Visit (HOSPITAL_COMMUNITY)
Admission: RE | Admit: 2017-04-04 | Discharge: 2017-04-04 | Disposition: A | Discharge status: Home / Self Care | Payer: Medicare Other | Source: Ambulatory Visit | Attending: Gastroenterology | Admitting: Gastroenterology

## 2017-04-04 ENCOUNTER — Encounter (HOSPITAL_COMMUNITY): Payer: Self-pay | Admitting: *Deleted

## 2017-04-04 ENCOUNTER — Encounter (HOSPITAL_COMMUNITY)
Admission: RE | Disposition: A | Discharge status: Home / Self Care | Payer: Self-pay | Source: Ambulatory Visit | Attending: Gastroenterology

## 2017-04-04 DIAGNOSIS — Z882 Allergy status to sulfonamides status: Secondary | ICD-10-CM | POA: Insufficient documentation

## 2017-04-04 DIAGNOSIS — Z1211 Encounter for screening for malignant neoplasm of colon: Secondary | ICD-10-CM | POA: Insufficient documentation

## 2017-04-04 DIAGNOSIS — K621 Rectal polyp: Secondary | ICD-10-CM | POA: Diagnosis not present

## 2017-04-04 DIAGNOSIS — Z9889 Other specified postprocedural states: Secondary | ICD-10-CM | POA: Insufficient documentation

## 2017-04-04 DIAGNOSIS — K219 Gastro-esophageal reflux disease without esophagitis: Secondary | ICD-10-CM | POA: Diagnosis not present

## 2017-04-04 DIAGNOSIS — D128 Benign neoplasm of rectum: Secondary | ICD-10-CM | POA: Diagnosis not present

## 2017-04-04 DIAGNOSIS — K648 Other hemorrhoids: Secondary | ICD-10-CM | POA: Diagnosis not present

## 2017-04-04 DIAGNOSIS — Z860100 Personal history of colon polyps, unspecified: Secondary | ICD-10-CM

## 2017-04-04 DIAGNOSIS — Z79899 Other long term (current) drug therapy: Secondary | ICD-10-CM | POA: Insufficient documentation

## 2017-04-04 DIAGNOSIS — Z9842 Cataract extraction status, left eye: Secondary | ICD-10-CM | POA: Insufficient documentation

## 2017-04-04 DIAGNOSIS — D123 Benign neoplasm of transverse colon: Secondary | ICD-10-CM | POA: Diagnosis not present

## 2017-04-04 DIAGNOSIS — K644 Residual hemorrhoidal skin tags: Secondary | ICD-10-CM | POA: Diagnosis not present

## 2017-04-04 DIAGNOSIS — Z9841 Cataract extraction status, right eye: Secondary | ICD-10-CM | POA: Diagnosis not present

## 2017-04-04 DIAGNOSIS — K5900 Constipation, unspecified: Secondary | ICD-10-CM | POA: Insufficient documentation

## 2017-04-04 DIAGNOSIS — D122 Benign neoplasm of ascending colon: Secondary | ICD-10-CM | POA: Diagnosis not present

## 2017-04-04 DIAGNOSIS — Q438 Other specified congenital malformations of intestine: Secondary | ICD-10-CM | POA: Insufficient documentation

## 2017-04-04 HISTORY — PX: COLONOSCOPY: SHX5424

## 2017-04-04 SURGERY — COLONOSCOPY
Anesthesia: Moderate Sedation

## 2017-04-04 MED ORDER — MEPERIDINE HCL 100 MG/ML IJ SOLN
INTRAMUSCULAR | Status: DC | PRN
  Administered 2017-04-04: 25 mg via INTRAVENOUS
  Administered 2017-04-04: 50 mg via INTRAVENOUS
  Administered 2017-04-04: 25 mg via INTRAVENOUS

## 2017-04-04 MED ORDER — MIDAZOLAM HCL 5 MG/5ML IJ SOLN
INTRAMUSCULAR | Status: AC
  Filled 2017-04-04: qty 10

## 2017-04-04 MED ORDER — MIDAZOLAM HCL 5 MG/5ML IJ SOLN
INTRAMUSCULAR | Status: DC | PRN
  Administered 2017-04-04 (×2): 1 mg via INTRAVENOUS
  Administered 2017-04-04: 2 mg via INTRAVENOUS
  Administered 2017-04-04: 1 mg via INTRAVENOUS

## 2017-04-04 MED ORDER — SODIUM CHLORIDE 0.9 % IV SOLN
INTRAVENOUS | Status: AC | PRN
  Administered 2017-04-04: 650 mL via INTRAMUSCULAR

## 2017-04-04 MED ORDER — MEPERIDINE HCL 100 MG/ML IJ SOLN
INTRAMUSCULAR | Status: AC
  Filled 2017-04-04: qty 2

## 2017-04-04 MED ORDER — ONDANSETRON HCL 4 MG/2ML IJ SOLN
INTRAMUSCULAR | Status: AC
  Administered 2017-04-04: 4 mg
  Filled 2017-04-04: qty 2

## 2017-04-04 NOTE — Discharge Instructions (Signed)
You had 6 polyps removed. You have  SMALL internal  And large external hemorrhoids.   DRINK WATER TO KEEP YOUR URINE LIGHT YELLOW.  FOLLOW A HIGH FIBER DIET. AVOID ITEMS THAT CAUSE BLOATING & GAS. SEE INFO BELOW.   YOUR BIOPSY RESULTS WILL BE AVAILABLE IN MY CHART AFTER NOV 26 AND MY OFFICE WILL CONTACT YOU IN 10-14 DAYS WITH YOUR RESULTS.   Next colonoscopy in 3-5 years.    Colonoscopy Care After Read the instructions outlined below and refer to this sheet in the next week. These discharge instructions provide you with general information on caring for yourself after you leave the hospital. While your treatment has been planned according to the most current medical practices available, unavoidable complications occasionally occur. If you have any problems or questions after discharge, call DR. Louay Myrie, [phone].  ACTIVITY  You may resume your regular activity, but move at a slower pace for the next 24 hours.   Take frequent rest periods for the next 24 hours.   Walking will help get rid of the air and reduce the bloated feeling in your belly (abdomen).   No driving for 24 hours (because of the medicine (anesthesia) used during the test).   You may shower.   Do not sign any important legal documents or operate any machinery for 24 hours (because of the anesthesia used during the test).    NUTRITION  Drink plenty of fluids.   You may resume your normal diet as instructed by your doctor.   Begin with a light meal and progress to your normal diet. Heavy or fried foods are harder to digest and may make you feel sick to your stomach (nauseated).   Avoid alcoholic beverages for 24 hours or as instructed.    MEDICATIONS  You may resume your normal medications.   WHAT YOU CAN EXPECT TODAY  Some feelings of bloating in the abdomen.   Passage of more gas than usual.   Spotting of blood in your stool or on the toilet paper  .  IF YOU HAD POLYPS REMOVED DURING THE  COLONOSCOPY:  Eat a soft diet IF YOU HAVE NAUSEA, BLOATING, ABDOMINAL PAIN, OR VOMITING.    FINDING OUT THE RESULTS OF YOUR TEST Not all test results are available during your visit. DR. Oneida Alar WILL CALL YOU WITHIN 14 DAYS OF YOUR PROCEDUE WITH YOUR RESULTS. Do not assume everything is normal if you have not heard from DR. Verdella Laidlaw, CALL HER OFFICE AT [phone].  SEEK IMMEDIATE MEDICAL ATTENTION AND CALL THE OFFICE: [phone] IF:  You have more than a spotting of blood in your stool.   Your belly is swollen (abdominal distention).   You are nauseated or vomiting.   You have a temperature over 101F.   You have abdominal pain or discomfort that is severe or gets worse throughout the day.   High-Fiber Diet A high-fiber diet changes your normal diet to include more whole grains, legumes, fruits, and vegetables. Changes in the diet involve replacing refined carbohydrates with unrefined foods. The calorie level of the diet is essentially unchanged. The Dietary Reference Intake (recommended amount) for adult males is 38 grams per day. For adult females, it is 25 grams per day. Pregnant and lactating women should consume 28 grams of fiber per day. Fiber is the intact part of a plant that is not broken down during digestion. Functional fiber is fiber that has been isolated from the plant to provide a beneficial effect in the body. PURPOSE  Increase stool bulk.   Ease and regulate bowel movements.   Lower cholesterol.   REDUCE RISK OF COLON CANCER  INDICATIONS THAT YOU NEED MORE FIBER  Constipation and hemorrhoids.   Uncomplicated diverticulosis (intestine condition) and irritable bowel syndrome.   Weight management.   As a protective measure against hardening of the arteries (atherosclerosis), diabetes, and cancer.   GUIDELINES FOR INCREASING FIBER IN THE DIET  Start adding fiber to the diet slowly. A gradual increase of about 5 more grams (2 slices of whole-wheat bread, 2  servings of most fruits or vegetables, or 1 bowl of high-fiber cereal) per day is best. Too rapid an increase in fiber may result in constipation, flatulence, and bloating.   Drink enough water and fluids to keep your urine clear or pale yellow. Water, juice, or caffeine-free drinks are recommended. Not drinking enough fluid may cause constipation.   Eat a variety of high-fiber foods rather than one type of fiber.   Try to increase your intake of fiber through using high-fiber foods rather than fiber pills or supplements that contain small amounts of fiber.   The goal is to change the types of food eaten. Do not supplement your present diet with high-fiber foods, but replace foods in your present diet.   INCLUDE A VARIETY OF FIBER SOURCES  Replace refined and processed grains with whole grains, canned fruits with fresh fruits, and incorporate other fiber sources. White rice, white breads, and most bakery goods contain little or no fiber.   Brown whole-grain rice, buckwheat oats, and many fruits and vegetables are all good sources of fiber. These include: broccoli, Brussels sprouts, cabbage, cauliflower, beets, sweet potatoes, white potatoes (skin on), carrots, tomatoes, eggplant, squash, berries, fresh fruits, and dried fruits.   Cereals appear to be the richest source of fiber. Cereal fiber is found in whole grains and bran. Bran is the fiber-rich outer coat of cereal grain, which is largely removed in refining. In whole-grain cereals, the bran remains. In breakfast cereals, the largest amount of fiber is found in those with "bran" in their names. The fiber content is sometimes indicated on the label.   You may need to include additional fruits and vegetables each day.   In baking, for 1 cup white flour, you may use the following substitutions:   1 cup whole-wheat flour minus 2 tablespoons.   1/2 cup white flour plus 1/2 cup whole-wheat flour.   Polyps, Colon  A polyp is extra tissue that  grows inside your body. Colon polyps grow in the large intestine. The large intestine, also called the colon, is part of your digestive system. It is a long, hollow tube at the end of your digestive tract where your body makes and stores stool. Most polyps are not dangerous. They are benign. This means they are not cancerous. But over time, some types of polyps can turn into cancer. Polyps that are smaller than a pea are usually not harmful. But larger polyps could someday become or may already be cancerous. To be safe, doctors remove all polyps and test them.   WHO GETS POLYPS? Anyone can get polyps, but certain people are more likely than others. You may have a greater chance of getting polyps if:  You are over 50.   You have had polyps before.   Someone in your family has had polyps.   Someone in your family has had cancer of the large intestine.   Find out if someone in your family has had  polyps. You may also be more likely to get polyps if you:   Eat a lot of fatty foods   Smoke   Drink alcohol   Do not exercise  Eat too much   PREVENTION There is not one sure way to prevent polyps. You might be able to lower your risk of getting them if you:  Eat more fruits and vegetables and less fatty food.   Do not smoke.   Avoid alcohol.   Exercise every day.   Lose weight if you are overweight.   Eating more calcium and folate can also lower your risk of getting polyps. Some foods that are rich in calcium are milk, cheese, and broccoli. Some foods that are rich in folate are chickpeas, kidney beans, and spinach.   Hemorrhoids Hemorrhoids are dilated (enlarged) veins around the rectum. Sometimes clots will form in the veins. This makes them swollen and painful. These are called thrombosed hemorrhoids. Causes of hemorrhoids include:  Constipation.   Straining to have a bowel movement.   HEAVY LIFTING  HOME CARE INSTRUCTIONS  Eat a well balanced diet and drink 6 to 8 glasses  of water every day to avoid constipation. You may also use a bulk laxative.   Avoid straining to have bowel movements.   Keep anal area dry and clean.   Do not use a donut shaped pillow or sit on the toilet for long periods. This increases blood pooling and pain.   Move your bowels when your body has the urge; this will require less straining and will decrease pain and pressure.

## 2017-04-04 NOTE — Interval H&P Note (Signed)
History and Physical Interval Note:  04/04/2017 2:25 PM  Kim Mann  has presented today for surgery, with the diagnosis of personal history of polyps  The various methods of treatment have been discussed with the patient and family. After consideration of risks, benefits and other options for treatment, the patient has consented to  Procedure(s) with comments: COLONOSCOPY (N/A) - 12:45pm - LM for pt to arrive at 12:15 as a surgical intervention .  The patient's history has been reviewed, patient examined, no change in status, stable for surgery.  I have reviewed the patient's chart and labs.  Questions were answered to the patient's satisfaction.     [employer]

## 2017-04-04 NOTE — Op Note (Signed)
[facility] Patient Name: Kim Mann Procedure Date: 04/04/2017 2:10 PM MRN: [id number] Date of Birth: 23-Jan-1951 Attending MD: Barney Drain MD, MD CSN: [id number] Age: 66 Admit Type: Outpatient Procedure:                Colonoscopy with COLD FORCEPS/SNARE POLYPECTOMY Indications:              High risk colon cancer surveillance: Personal                            history of colonic polyps Providers:                Barney Drain MD, MD, Charlsie Quest. Theda Sers RN, RN,                            Aram Candela Referring MD:              Medicines:                Meperidine 100 mg IV, Midazolam 5 mg IV Complications:            No immediate complications. Estimated Blood Loss:     Estimated blood loss was minimal. Procedure:                Pre-Anesthesia Assessment:                           - Prior to the procedure, a History and Physical                            was performed, and patient medications and                            allergies were reviewed. The patient's tolerance of                            previous anesthesia was also reviewed. The risks                            and benefits of the procedure and the sedation                            options and risks were discussed with the patient.                            All questions were answered, and informed consent                            was obtained. Prior Anticoagulants: The patient has                            taken no previous anticoagulant or antiplatelet                            agents. ASA Grade Assessment: I - A normal, healthy  patient. After reviewing the risks and benefits,                            the patient was deemed in satisfactory condition to                            undergo the procedure. After obtaining informed                            consent, the colonoscope was passed under direct                            vision. Throughout the procedure, the  patient's                            blood pressure, pulse, and oxygen saturations were                            monitored continuously. The EC-[id number] ([id number])                            scope was introduced through the anus and advanced                            to the the cecum, identified by appendiceal orifice                            and ileocecal valve. The colonoscopy was                            technically difficult and complex due to restricted                            mobility of the colon and significant looping.                            Successful completion of the procedure was aided by                            increasing the dose of sedation medication and                            COLOWRAP. The ileocecal valve, appendiceal orifice,                            and rectum were photographed. The quality of the                            bowel preparation was good. Scope In: 2:40:08 PM Scope Out: 3:13:35 PM Scope Withdrawal Time: 0 hours 26 minutes 24 seconds  Total Procedure Duration: 0 hours 33 minutes 27 seconds  Findings:      Four sessile polyps were found in the rectum, hepatic flexure and distal  ascending colon. The polyps were 3 to 5 mm in size. These polyps were       removed with a cold snare. Resection and retrieval were complete.      Two sessile polyps were found in the hepatic flexure. The polyps were 2       to 3 mm in size. These polyps were removed with a cold biopsy forceps.       Resection and retrieval were complete.      Multiple small and large-mouthed diverticula were found in the entire       colon.      The recto-sigmoid colon and sigmoid colon revealed significantly       excessive looping.      Internal hemorrhoids were found during retroflexion. The hemorrhoids       were small.      External hemorrhoids were found during retroflexion. The hemorrhoids       were moderate. Impression:               - Four 3 to 5 mm polyps in  the rectum(2: BTL 2), at                            the hepatic flexure and in the distal ascending                            colon,                           - Two 2 to 3 mm polyps at the hepatic flexure,                           - SEVERE Diverticulosis in the entire examined                            colon.                           - There was significant looping of the LEFT col on                            WITH RESTRICTED MOBILITY AT THE RECTO-SIGMOID                            JUNCTION.                           - Internal hemorrhoids.                           - External hemorrhoids. Moderate Sedation:      Moderate (conscious) sedation was administered by the endoscopy nurse       and supervised by the endoscopist. The following parameters were       monitored: oxygen saturation, heart rate, blood pressure, and response       to care. Total physician intraservice time was 38 minutes. Recommendation:           - Repeat colonoscopy in 3 - 5 years for  surveillance.                           - High fiber diet.                           - Continue present medications.                           - Await pathology results.                           - Patient has a contact number available for                            emergencies. The signs and symptoms of potential                            delayed complications were discussed with the                            patient. Return to normal activities tomorrow.                            Written discharge instructions were provided to the                            patient. Procedure Code(s):        --- Professional ---                           [phone], Colonoscopy, flexible; with removal of                            tumor(s), polyp(s), or other lesion(s) by snare                            technique                           99152, Moderate sedation services provided by the                             same physician or other qualified health care                            professional performing the diagnostic or                            therapeutic service that the sedation supports,                            requiring the presence of an independent trained                            observer to assist in the monitoring of the  patient's level of consciousness and physiological                            status; initial 15 minutes of intraservice time,                            patient age 101 years or older                           [phone], Moderate sedation services; each additional                            15 minutes intraservice time                           [phone], Moderate sedation services; each additional                            15 minutes intraservice time Diagnosis Code(s):        --- Professional ---                           Z86.010, Personal history of colonic polyps                           K62.1, Rectal polyp                           D12.2, Benign neoplasm of ascending colon                           D12.3, Benign neoplasm of transverse colon (hepatic                            flexure or splenic flexure)                           K64.4, Residual hemorrhoidal skin tags                           K64.8, Other hemorrhoids                           K57.30, Diverticulosis of large intestine without                            perforation or abscess without bleeding CPT copyright 2016 [employer]. All rights reserved. The codes documented in this report are preliminary and upon coder review may  be revised to meet current compliance requirements. Barney Drain, MD Barney Drain MD, MD 04/04/2017 3:28:11 PM This report has been signed electronically. Number of Addenda: 0

## 2017-04-10 ENCOUNTER — Telehealth: Payer: Self-pay | Admitting: Gastroenterology

## 2017-04-10 ENCOUNTER — Encounter (HOSPITAL_COMMUNITY): Payer: Self-pay | Admitting: Gastroenterology

## 2017-04-10 NOTE — Telephone Encounter (Signed)
Please call pt. She had FIVE simple adenomas AND ONE HYPERPLASTIC POLYP REMOVED.   DRINK WATER TO KEEP YOUR URINE LIGHT YELLOW.  FOLLOW A HIGH FIBER DIET. AVOID ITEMS THAT CAUSE BLOATING & GAS.   Next colonoscopy in 3 years.

## 2017-04-11 NOTE — Telephone Encounter (Signed)
I spoke with patient about results and she verbalized understanding and had no questions 

## 2017-04-11 NOTE — Telephone Encounter (Signed)
Reminder in epic °

## 2018-02-08 ENCOUNTER — Ambulatory Visit: Payer: Medicare Other | Admitting: Gastroenterology

## 2018-04-25 ENCOUNTER — Other Ambulatory Visit: Payer: Self-pay | Admitting: *Deleted

## 2018-04-25 ENCOUNTER — Encounter: Payer: Self-pay | Admitting: Gastroenterology

## 2018-04-25 ENCOUNTER — Ambulatory Visit (INDEPENDENT_AMBULATORY_CARE_PROVIDER_SITE_OTHER): Payer: Medicare Other | Admitting: Gastroenterology

## 2018-04-25 DIAGNOSIS — K648 Other hemorrhoids: Secondary | ICD-10-CM | POA: Insufficient documentation

## 2018-04-25 DIAGNOSIS — N816 Rectocele: Secondary | ICD-10-CM

## 2018-04-25 DIAGNOSIS — M6289 Other specified disorders of muscle: Secondary | ICD-10-CM | POA: Insufficient documentation

## 2018-04-25 NOTE — Assessment & Plan Note (Signed)
SYMPTOMS NOT IDEALLY CONTROLLED AND ASSOCIATED WITH NEED TO USE ENEMA TO FLUSH RECTUM IF CONSTIPATED.  SEE THE GYN REGARDING YOUR RECTOCELE MANAGEMENT.  DRINK WATER TO KEEP YOUR URINE LIGHT YELLOW. FOLLOW A HIGH FIBER DIET. AVOID ITEMS THAT CAUSE BLOATING & GAS.  HANDOUT GIVEN. USE PREPARATION H THREE or FOUR TIMES  A DAY WHEN NEEDED TO RELIEVE RECTAL PAIN/PRESSURE/BLEEDING.   FOLLOW UP IN 6 MOS.

## 2018-04-25 NOTE — Assessment & Plan Note (Signed)
SYMPTOMS NOT IDEALLY CONTROLLED AND EXACERBATED BY CONSTIPATION.  DRINK WATER TO KEEP YOUR URINE LIGHT YELLOW. FOLLOW A HIGH FIBER DIET. AVOID ITEMS THAT CAUSE BLOATING & GAS.  HANDOUT GIVEN. USE PREPARATION H THREE or FOUR TIMES  A DAY WHEN NEEDED TO RELIEVE RECTAL PAIN/PRESSURE/BLEEDING.  FOLLOW UP IN 6 MOS.

## 2018-04-25 NOTE — Progress Notes (Signed)
   Subjective:    Patient ID: Kim Mann, female    DOB: 06/11/1950, 67 y.o.   MRN: [id number]  [facility]  HPI MADE APPT IN JUN 2019. HAVING TROUBLE WITH HEMORRHOIDS AND RECTOCELE. 12 YRS AGO HAD HEMORRHOID SURGERY AND IT WAS THE WORSE. WHEN SHE BECOMES CONSTIPATION. RECTOCELE BULGES INTO HER VAGINA AND TAKES ENEMA BOTTLE TO USE THAT SO SHE DOESN'T HAVE TO STRAIN. HAPPENS 2X/MO. HASN'T DECIDED IF SHE WANTS SURGERY FOR RECTOCELE. HEARTBURN: 1-2X/WEEK. CONSTIPATION: ONCE A WEEK AND ASSOCIATED RLQ AND AFTER BM IT GOES AWAY. OMEPRAZOLE 20 MG OTC MANAGES SYMPTOMS. 2 KIDS: AGE 52, 89.  PT DENIES FEVER, CHILLS, HEMATOCHEZIA, HEMATEMESIS, nausea, vomiting, melena, diarrhea, CHEST PAIN, SHORTNESS OF BREATH,  CHANGE IN BOWEL IN HABITS,  problems swallowing, OR problems with sedation.  Past Medical History:  Diagnosis Date  . GERD (gastroesophageal reflux disease)    Past Surgical History:  Procedure Laterality Date  . ADJUSTABLE SUTURE MANIPULATION Right 02/19/2016   Procedure: ADJUSTABLE SUTURE MANIPULATION;  Surgeon: Everitt Amber, MD;  Location: [place];  Service: Ophthalmology;  Laterality: Right;  . CATARACT EXTRACTION, BILATERAL    . COLONOSCOPY N/A 04/04/2017   Procedure: COLONOSCOPY;  Surgeon: Danie Binder, MD;  [facility];  Service: Endoscopy;  Laterality: N/A;  12:45pm - LM for pt to arrive at 12:15  . 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 Sulfa Antibiotics Rash   Current Outpatient Medications  Medication Sig    . Multiple Vitamin (MULTIVITAMIN) tablet Take 1 tablet by mouth daily.    . Omega-3 Fatty Acids (FISH OIL) 1200 MG CAPS Take 1,200 mg daily by mouth.     Marland Kitchen omeprazole (PRILOSEC) 20 MG capsule Take 20 mg by mouth daily.    Marland Kitchen  tobramycin-dexamethasone (TOBRADEX) ophthalmic ointment Place 1 application into the right eye 2 (two) times daily. (Patient not taking: Reported on 03/09/2017)     Review of Systems PER HPI OTHERWISE ALL SYSTEMS ARE NEGATIVE.    Objective:   Physical Exam  Constitutional: She is oriented to person, place, and time. She appears well-developed and well-nourished. No distress.  HENT:  Head: Normocephalic and atraumatic.  Mouth/Throat: Oropharynx is clear and moist. No oropharyngeal exudate.  Eyes: Pupils are equal, round, and reactive to light. No scleral icterus.  Neck: Normal range of motion. Neck supple.  Cardiovascular: Normal rate, regular rhythm and normal heart sounds.  Pulmonary/Chest: Effort normal and breath sounds normal. No respiratory distress.  Abdominal: Soft. Bowel sounds are normal. She exhibits no distension. There is no tenderness.  Genitourinary: Rectal exam shows external hemorrhoid and internal hemorrhoid. Rectal exam shows no fissure and no tenderness.  Genitourinary Comments: NORMAL RECTAL TONE  Musculoskeletal: She exhibits no edema.  Lymphadenopathy:    She has no cervical adenopathy.  Neurological: She is alert and oriented to person, place, and time.  Psychiatric: She has a normal mood and affect.  Vitals reviewed.     Assessment & Plan:

## 2018-04-25 NOTE — Patient Instructions (Addendum)
  SEE THE GYN REGARDING YOUR RECTOCELE MANAGEMENT.  DRINK WATER TO KEEP YOUR URINE LIGHT YELLOW.  FOLLOW A HIGH FIBER DIET. AVOID ITEMS THAT CAUSE BLOATING & GAS. SEE INFO BELOW.  USE PREPARATION H THREE or FOUR TIMES  A DAY WHEN NEEDED TO RELIEVE RECTAL PAIN/PRESSURE/BLEEDING.   FOLLOW UP IN 6 MOS.   High-Fiber Diet A high-fiber diet changes your normal diet to include more whole grains, legumes, fruits, and vegetables. Changes in the diet involve replacing refined carbohydrates with unrefined foods. The calorie level of the diet is essentially unchanged. The Dietary Reference Intake (recommended amount) for adult males is 38 grams per day. For adult females, it is 25 grams per day. Pregnant and lactating women should consume 28 grams of fiber per day. Fiber is the intact part of a plant that is not broken down during digestion. Functional fiber is fiber that has been isolated from the plant to provide a beneficial effect in the body.  PURPOSE  Increase stool bulk.   Ease and regulate bowel movements.   Lower cholesterol.   REDUCE RISK OF COLON CANCER  INDICATIONS THAT YOU NEED MORE FIBER  Constipation and hemorrhoids.   Uncomplicated diverticulosis (intestine condition) and irritable bowel syndrome.   Weight management.   As a protective measure against hardening of the arteries (atherosclerosis), diabetes, and cancer.   GUIDELINES FOR INCREASING FIBER IN THE DIET  Start adding fiber to the diet slowly. A gradual increase of about 5 more grams (2 slices of whole-wheat bread, 2 servings of most fruits or vegetables, or 1 bowl of high-fiber cereal) per day is best. Too rapid an increase in fiber may result in constipation, flatulence, and bloating.   Drink enough water and fluids to keep your urine clear or pale yellow. Water, juice, or caffeine-free drinks are recommended. Not drinking enough fluid may cause constipation.   Eat a variety of high-fiber foods rather than one  type of fiber.   Try to increase your intake of fiber through using high-fiber foods rather than fiber pills or supplements that contain small amounts of fiber.   The goal is to change the types of food eaten. Do not supplement your present diet with high-fiber foods, but replace foods in your present diet.  INCLUDE A VARIETY OF FIBER SOURCES  Replace refined and processed grains with whole grains, canned fruits with fresh fruits, and incorporate other fiber sources. White rice, white breads, and most bakery goods contain little or no fiber.   Brown whole-grain rice, buckwheat oats, and many fruits and vegetables are all good sources of fiber. These include: broccoli, Brussels sprouts, cabbage, cauliflower, beets, sweet potatoes, white potatoes (skin on), carrots, tomatoes, eggplant, squash, berries, fresh fruits, and dried fruits.   Cereals appear to be the richest source of fiber. Cereal fiber is found in whole grains and bran. Bran is the fiber-rich outer coat of cereal grain, which is largely removed in refining. In whole-grain cereals, the bran remains. In breakfast cereals, the largest amount of fiber is found in those with "bran" in their names. The fiber content is sometimes indicated on the label.   You may need to include additional fruits and vegetables each day.   In baking, for 1 cup white flour, you may use the following substitutions:   1 cup whole-wheat flour minus 2 tablespoons.   1/2 cup white flour plus 1/2 cup whole-wheat flour.

## 2018-04-26 NOTE — Progress Notes (Signed)
ON RECALL  °

## 2018-04-26 NOTE — Progress Notes (Signed)
CC'D TO PCP °

## 2018-10-22 ENCOUNTER — Encounter: Payer: Self-pay | Admitting: Gastroenterology

## 2019-02-13 ENCOUNTER — Encounter: Payer: Self-pay | Admitting: Gastroenterology

## 2019-02-13 ENCOUNTER — Other Ambulatory Visit: Payer: Self-pay

## 2019-02-13 ENCOUNTER — Ambulatory Visit (INDEPENDENT_AMBULATORY_CARE_PROVIDER_SITE_OTHER): Payer: Medicare Other | Admitting: Gastroenterology

## 2019-02-13 DIAGNOSIS — K219 Gastro-esophageal reflux disease without esophagitis: Secondary | ICD-10-CM | POA: Diagnosis not present

## 2019-02-13 DIAGNOSIS — K648 Other hemorrhoids: Secondary | ICD-10-CM | POA: Diagnosis not present

## 2019-02-13 NOTE — Assessment & Plan Note (Signed)
SYMPTOMS CONTROLLED/RESOLVED.  CONTINUE TO MONITOR SYMPTOMS. CONTINUE OMEPRAZOLE.  TAKE 30 MINUTES PRIOR TO YOUR FIRST MEAL DAILY. FOLLOW UP IN THE OFFICE WILL BE SCHEDULED IF NEEDED. PLEASE CALL WITH QUESTIONS OR CONCERNS.

## 2019-02-13 NOTE — Progress Notes (Signed)
Subjective:    Patient ID: Kim Mann, female    DOB: March 31, 1951, 68 y.o.   MRN: [id number]  [facility] [place] [place]  HPI HERE O HAVE HER HEMORRHOIDS CHECKED. HAS FLARE UP IF SHE DRINKS WATER. NO BLEEDING SINCE [id number]). BMs: MOSTLY ONCE A DAY WITH GREEN, STRING BEANS, FRESH VEGGIES, AND FRUIT. STILL EXERCISING 4-5X/WEEK. 2 MILES A WEEK-WALKS AND JOGS. HUSBAND STILL WORKS 2 DAYS A WEEK. IF EATS HOT & SPICY GETS BURNING IN UPPER EPIGASTRIC AND BLISTER ON HER LIP. ENERGY LEVEL: VERY GOOD. APPETITE: LIKE A PIG. IF MISSES MEDS SHE MAY HAVE HEARTBURN BUT ZERO SYMPTOMS IF NO PROBLEMS.   PT DENIES FEVER, CHILLS, HEMATOCHEZIA, HEMATEMESIS, nausea, vomiting, melena, diarrhea, CHEST PAIN, SHORTNESS OF BREATH, CHANGE IN BOWEL IN HABITS, constipation, problems swallowing, OR heartburn or indigestion.  Past Medical History:  Diagnosis Date  . GERD (gastroesophageal reflux disease)   . H. pylori infection    BREATH TEST NEGATIVE FOLLOWING ABX.   Past Surgical History:  Procedure Laterality Date  . ADJUSTABLE SUTURE MANIPULATION Right 02/19/2016   Procedure: ADJUSTABLE SUTURE MANIPULATION;  Surgeon: Everitt Amber, MD;  Location: [facility];  Service: Ophthalmology;  Laterality: Right;  . CATARACT EXTRACTION, BILATERAL    . COLONOSCOPY N/A 04/04/2017   Procedure: COLONOSCOPY;  Surgeon: Danie Binder, MD;  [facility];  Service: Endoscopy;  Laterality: N/A;  12:45pm - LM for pt to arrive at 12:15  . STRABISMUS SURGERY Right 02/19/2016   Procedure: REPAIR STRABISMUS;  Surgeon: Everitt Amber, MD;  Location: [facility];  Service: Ophthalmology;  Laterality: Right;   Allergies  Allergen Reactions  . Sulfa Antibiotics Rash   Current Outpatient Medications  Medication Sig    . omeprazole (PRILOSEC) 20 MG capsule Take 20 mg by mouth daily.    . Multiple Vitamin (MULTIVITAMIN) tablet Take 1 tablet by mouth daily.    . Omega-3 Fatty Acids  (FISH OIL) 1200 MG CAPS Take 1,200 mg daily by mouth.     .       Review of Systems PER HPI OTHERWISE ALL SYSTEMS ARE NEGATIVE.    Objective:   Physical Exam Vitals signs reviewed.  Constitutional:      General: She is not in acute distress.    Appearance: She is well-developed.  HENT:     Head: Normocephalic and atraumatic.     Mouth/Throat:     Comments: MASK IN PLACE Eyes:     General: No scleral icterus.    Pupils: Pupils are equal, round, and reactive to light.  Neck:     Musculoskeletal: Normal range of motion and neck supple.  Cardiovascular:     Rate and Rhythm: Normal rate and regular rhythm.     Heart sounds: Normal heart sounds.  Pulmonary:     Effort: Pulmonary effort is normal. No respiratory distress.     Breath sounds: Normal breath sounds.  Abdominal:     General: Bowel sounds are normal. There is no distension.     Palpations: Abdomen is soft.     Tenderness: There is no abdominal tenderness.  Musculoskeletal:     Right lower leg: No edema.     Left lower leg: No edema.  Lymphadenopathy:     Cervical: No cervical adenopathy.  Skin:    General: Skin is warm and dry.  Neurological:     General: No focal deficit present.     Mental Status: She is alert and oriented to  person, place, and time.  Psychiatric:        Mood and Affect: Mood normal.        Thought Content: Thought content normal.           Assessment & Plan:

## 2019-02-13 NOTE — Assessment & Plan Note (Signed)
NO BRBPR. SYMPTOMS CONTROLLED/RESOLVED.   CONTINUE TO MONITOR SYMPTOMS. FOLLOW UP IN THE OFFICE WILL BE SCHEDULED IF NEEDED. PLEASE CALL WITH QUESTIONS OR CONCERNS.

## 2020-03-17 ENCOUNTER — Encounter: Payer: Self-pay | Admitting: Internal Medicine

## 2020-04-15 ENCOUNTER — Encounter: Payer: Self-pay | Admitting: Obstetrics and Gynecology

## 2020-04-17 ENCOUNTER — Encounter: Payer: Self-pay | Admitting: Obstetrics and Gynecology

## 2020-04-17 ENCOUNTER — Other Ambulatory Visit: Payer: Self-pay

## 2020-04-17 ENCOUNTER — Ambulatory Visit (INDEPENDENT_AMBULATORY_CARE_PROVIDER_SITE_OTHER): Payer: Medicare Other | Admitting: Obstetrics and Gynecology

## 2020-04-17 ENCOUNTER — Other Ambulatory Visit (HOSPITAL_COMMUNITY)
Admission: RE | Admit: 2020-04-17 | Discharge: 2020-04-17 | Disposition: A | Discharge status: Home / Self Care | Payer: Medicare Other | Source: Ambulatory Visit | Attending: Obstetrics and Gynecology | Admitting: Obstetrics and Gynecology

## 2020-04-17 VITALS — BP 120/70 | Ht 67.0 in | Wt 157.2 lb

## 2020-04-17 DIAGNOSIS — N816 Rectocele: Secondary | ICD-10-CM

## 2020-04-17 DIAGNOSIS — Z0000 Encounter for general adult medical examination without abnormal findings: Secondary | ICD-10-CM

## 2020-04-17 DIAGNOSIS — Z124 Encounter for screening for malignant neoplasm of cervix: Secondary | ICD-10-CM

## 2020-04-17 DIAGNOSIS — Z01419 Encounter for gynecological examination (general) (routine) without abnormal findings: Secondary | ICD-10-CM | POA: Diagnosis present

## 2020-04-17 DIAGNOSIS — Z1231 Encounter for screening mammogram for malignant neoplasm of breast: Secondary | ICD-10-CM | POA: Diagnosis not present

## 2020-04-17 DIAGNOSIS — Z1151 Encounter for screening for human papillomavirus (HPV): Secondary | ICD-10-CM | POA: Diagnosis not present

## 2020-04-17 NOTE — Patient Instructions (Signed)
Institute of Medicine Recommended Dietary Allowances for Calcium and Vitamin D  Age (yr) Calcium Recommended Dietary Allowance (mg/day) Vitamin D Recommended Dietary Allowance (international units/day)  9-18 1,300 600  19-50 1,000 600  51-70 1,200 600  71 and older 1,200 800  Data from Institute of Medicine. Dietary reference intakes: calcium, vitamin D. [place], [place]: National Academies Press; 2011.     Exercising to Stay Healthy To become healthy and stay healthy, it is recommended that you do moderate-intensity and vigorous-intensity exercise. You can tell that you are exercising at a moderate intensity if your heart starts beating faster and you start breathing faster but can still hold a conversation. You can tell that you are exercising at a vigorous intensity if you are breathing much harder and faster and cannot hold a conversation while exercising. Exercising regularly is important. It has many health benefits, such as:  Improving overall fitness, flexibility, and endurance.  Increasing bone density.  Helping with weight control.  Decreasing body fat.  Increasing muscle strength.  Reducing stress and tension.  Improving overall health. How often should I exercise? Choose an activity that you enjoy, and set realistic goals. Your health care provider can help you make an activity plan that works for you. Exercise regularly as told by your health care provider. This may include:  Doing strength training two times a week, such as: ? Lifting weights. ? Using resistance bands. ? Push-ups. ? Sit-ups. ? Yoga.  Doing a certain intensity of exercise for a given amount of time. Choose from these options: ? A total of 150 minutes of moderate-intensity exercise every week. ? A total of 75 minutes of vigorous-intensity exercise every week. ? A mix of moderate-intensity and vigorous-intensity exercise every week. Children, pregnant women, people who have not exercised  regularly, people who are overweight, and older adults may need to talk with a health care provider about what activities are safe to do. If you have a medical condition, be sure to talk with your health care provider before you start a new exercise program. What are some exercise ideas? Moderate-intensity exercise ideas include:  Walking 1 mile (1.6 km) in about 15 minutes.  Biking.  Hiking.  Golfing.  Dancing.  Water aerobics. Vigorous-intensity exercise ideas include:  Walking 4.5 miles (7.2 km) or more in about 1 hour.  Jogging or running 5 miles (8 km) in about 1 hour.  Biking 10 miles (16.1 km) or more in about 1 hour.  Lap swimming.  Roller-skating or in-line skating.  Cross-country skiing.  Vigorous competitive sports, such as football, basketball, and soccer.  Jumping rope.  Aerobic dancing. What are some everyday activities that can help me to get exercise?  Yard work, such as: ? Pushing a lawn mower. ? Raking and bagging leaves.  Washing your car.  Pushing a stroller.  Shoveling snow.  Gardening.  Washing windows or floors. How can I be more active in my day-to-day activities?  Use stairs instead of an elevator.  Take a walk during your lunch break.  If you drive, park your car farther away from your work or school.  If you take public transportation, get off one stop early and walk the rest of the way.  Stand up or walk around during all of your indoor phone calls.  Get up, stretch, and walk around every 30 minutes throughout the day.  Enjoy exercise with a friend. Support to continue exercising will help you keep a regular routine of activity. What guidelines can   I follow while exercising?  Before you start a new exercise program, talk with your health care provider.  Do not exercise so much that you hurt yourself, feel dizzy, or get very short of breath.  Wear comfortable clothes and wear shoes with good support.  Drink plenty of  water while you exercise to prevent dehydration or heat stroke.  Work out until your breathing and your heartbeat get faster. Where to find more information  U.S. Department of Health and Human Services: www.hhs.gov  Centers for Disease Control and Prevention (CDC): www.cdc.gov Summary  Exercising regularly is important. It will improve your overall fitness, flexibility, and endurance.  Regular exercise also will improve your overall health. It can help you control your weight, reduce stress, and improve your bone density.  Do not exercise so much that you hurt yourself, feel dizzy, or get very short of breath.  Before you start a new exercise program, talk with your health care provider. This information is not intended to replace advice given to you by your health care provider. Make sure you discuss any questions you have with your health care provider. Document Revised: 04/14/2017 Document Reviewed: 03/23/2017 Elsevier Patient Education  2020 Elsevier Inc.   Budget-Friendly Healthy Eating There are many ways to save money at the grocery store and continue to eat healthy. You can be successful if you:  Plan meals according to your budget.  Make a grocery list and only purchase food according to your grocery list.  Prepare food yourself. What are tips for following this plan?  Reading food labels  Compare food labels between brand name foods and the store brand. Often the nutritional value is the same, but the store brand is lower cost.  Look for products that do not have added sugar, fat, or salt (sodium). These often cost the same but are healthier for you. Products may be labeled as: ? Sugar-free. ? Nonfat. ? Low-fat. ? Sodium-free. ? Low-sodium.  Look for lean ground beef labeled as at least 92% lean and 8% fat. Shopping  Buy only the items on your grocery list and go only to the areas of the store that have the items on your list.  Use coupons only for foods  and brands you normally buy. Avoid buying items you wouldn't normally buy simply because they are on sale.  Check online and in newspapers for weekly deals.  Buy healthy items from the bulk bins when available, such as herbs, spices, flour, pasta, nuts, and dried fruit.  Buy fruits and vegetables that are in season. Prices are usually lower on in-season produce.  Look at the unit price on the price tag. Use it to compare different brands and sizes to find out which item is the best deal.  Choose healthy items that are often low-cost, such as carrots, potatoes, apples, bananas, and oranges. Dried or canned beans are a low-cost protein source.  Buy in bulk and freeze extra food. Items you can buy in bulk include meats, fish, poultry, frozen fruits, and frozen vegetables.  Avoid buying "ready-to-eat" foods, such as pre-cut fruits and vegetables and pre-made salads.  If possible, shop around to discover where you can find the best prices. Consider other retailers such as dollar stores, larger wholesale stores, local fruit and vegetable stands, and farmers markets.  Do not shop when you are hungry. If you shop while hungry, it may be hard to stick to your list and budget.  Resist impulse buying. Use your grocery list as   your official plan for the week.  Buy a variety of vegetables and fruits by purchasing fresh, frozen, and canned items.  Look at the top and bottom shelves for deals. Foods at eye level (eye level of an adult or child) are usually more expensive.  Be efficient with your time when shopping. The more time you spend at the store, the more money you are likely to spend.  To save money when choosing more expensive foods like meats and dairy: ? Choose cheaper cuts of meat, such as bone-in chicken thighs and drumsticks instead of skinless and boneless chicken. When you are ready to prepare the chicken, you can remove the skin yourself to make it healthier. ? Choose lean meats like  chicken or turkey instead of beef. ? Choose canned seafood, such as tuna, salmon, or sardines. ? Buy eggs as a low-cost source of protein. ? Buy dried beans and peas, such as lentils, split peas, or kidney beans instead of meats. Dried beans and peas are a good alternative source of protein. ? Buy the larger tubs of yogurt instead of individual-sized containers.  Choose water instead of sodas and other sweetened beverages.  Avoid buying chips, cookies, and other "junk food." These items are usually expensive and not healthy. Cooking  Make extra food and freeze the extras in meal-sized containers or in individual portions for fast meals and snacks.  Pre-cook on days when you have extra time to prepare meals in advance. You can keep these meals in the fridge or freezer and reheat for a quick meal.  When you come home from the grocery store, wash, peel, and cut fruits and vegetables so they are ready to use and eat. This will help reduce food waste. Meal planning  Do not eat out or get fast food. Prepare food at home.  Make a grocery list and make sure to bring it with you to the store. If you have a smart phone, you could use your phone to create your shopping list.  Plan meals and snacks according to a grocery list and budget you create.  Use leftovers in your meal plan for the week.  Look for recipes where you can cook once and make enough food for two meals.  Include budget-friendly meals like stews, casseroles, and stir-fry dishes.  Try some meatless meals or try "no cook" meals like salads.  Make sure that half your plate is filled with fruits or vegetables. Choose from fresh, frozen, or canned fruits and vegetables. If eating canned, remember to rinse them before eating. This will remove any excess salt added for packaging. Summary  Eating healthy on a budget is possible if you plan your meals according to your budget, purchase according to your budget and grocery list, and  prepare food yourself.  Tips for buying more food on a limited budget include buying generic brands, using coupons only for foods you normally buy, and buying healthy items from the bulk bins when available.  Tips for buying cheaper food to replace expensive food include choosing cheaper, lean cuts of meat, and buying dried beans and peas. This information is not intended to replace advice given to you by your health care provider. Make sure you discuss any questions you have with your health care provider. Document Revised: 05/03/2017 Document Reviewed: 05/03/2017 Elsevier Patient Education  2020 Elsevier Inc.   Bone Health Bones protect organs, store calcium, anchor muscles, and support the whole body. Keeping your bones strong is important, especially as you   get older. You can take actions to help keep your bones strong and healthy. Why is keeping my bones healthy important?  Keeping your bones healthy is important because your body constantly replaces bone cells. Cells get old, and new cells take their place. As we age, we lose bone cells because the body may not be able to make enough new cells to replace the old cells. The amount of bone cells and bone tissue you have is referred to as bone mass. The higher your bone mass, the stronger your bones. The aging process leads to an overall loss of bone mass in the body, which can increase the likelihood of:  Joint pain and stiffness.  Broken bones.  A condition in which the bones become weak and brittle (osteoporosis). A large decline in bone mass occurs in older adults. In women, it occurs about the time of menopause. What actions can I take to keep my bones healthy? Good health habits are important for maintaining healthy bones. This includes eating nutritious foods and exercising regularly. To have healthy bones, you need to get enough of the right minerals and vitamins. Most nutrition experts recommend getting these nutrients from the  foods that you eat. In some cases, taking supplements may also be recommended. Doing certain types of exercise is also important for bone health. What are the nutritional recommendations for healthy bones?  Eating a well-balanced diet with plenty of calcium and vitamin D will help to protect your bones. Nutritional recommendations vary from person to person. Ask your health care provider what is healthy for you. Here are some general guidelines. Get enough calcium Calcium is the most important (essential) mineral for bone health. Most people can get enough calcium from their diet, but supplements may be recommended for people who are at risk for osteoporosis. Good sources of calcium include:  Dairy products, such as low-fat or nonfat milk, cheese, and yogurt.  Dark green leafy vegetables, such as bok choy and broccoli.  Calcium-fortified foods, such as orange juice, cereal, bread, soy beverages, and tofu products.  Nuts, such as almonds. Follow these recommended amounts for daily calcium intake:  Children, age 1-3: 700 mg.  Children, age 4-8: 1,000 mg.  Children, age 9-13: 1,300 mg.  Teens, age 14-18: 1,300 mg.  Adults, age 19-50: 1,000 mg.  Adults, age 51-70: ? Men: 1,000 mg. ? Women: 1,200 mg.  Adults, age 71 or older: 1,200 mg.  Pregnant and breastfeeding females: ? Teens: 1,300 mg. ? Adults: 1,000 mg. Get enough vitamin D Vitamin D is the most essential vitamin for bone health. It helps the body absorb calcium. Sunlight stimulates the skin to make vitamin D, so be sure to get enough sunlight. If you live in a cold climate or you do not get outside often, your health care provider may recommend that you take vitamin D supplements. Good sources of vitamin D in your diet include:  Egg yolks.  Saltwater fish.  Milk and cereal fortified with vitamin D. Follow these recommended amounts for daily vitamin D intake:  Children and teens, age 1-18: 600 international  units.  Adults, age 50 or younger: 400-800 international units.  Adults, age 51 or older: 800-1,000 international units. Get other important nutrients Other nutrients that are important for bone health include:  Phosphorus. This mineral is found in meat, poultry, dairy foods, nuts, and legumes. The recommended daily intake for adult men and adult women is 700 mg.  Magnesium. This mineral is found in seeds, nuts, dark   green vegetables, and legumes. The recommended daily intake for adult men is 400-420 mg. For adult women, it is 310-320 mg.  Vitamin K. This vitamin is found in green leafy vegetables. The recommended daily intake is 120 mg for adult men and 90 mg for adult women. What type of physical activity is best for building and maintaining healthy bones? Weight-bearing and strength-building activities are important for building and maintaining healthy bones. Weight-bearing activities cause muscles and bones to work against gravity. Strength-building activities increase the strength of the muscles that support bones. Weight-bearing and muscle-building activities include:  Walking and hiking.  Jogging and running.  Dancing.  Gym exercises.  Lifting weights.  Tennis and racquetball.  Climbing stairs.  Aerobics. Adults should get at least 30 minutes of moderate physical activity on most days. Children should get at least 60 minutes of moderate physical activity on most days. Ask your health care provider what type of exercise is best for you. How can I find out if my bone mass is low? Bone mass can be measured with an X-ray test called a bone mineral density (BMD) test. This test is recommended for all women who are age 65 or older. It may also be recommended for:  Men who are age 70 or older.  People who are at risk for osteoporosis because of: ? Having bones that break easily. ? Having a long-term disease that weakens bones, such as kidney disease or rheumatoid  arthritis. ? Having menopause earlier than normal. ? Taking medicine that weakens bones, such as steroids, thyroid hormones, or hormone treatment for breast cancer or prostate cancer. ? Smoking. ? Drinking three or more alcoholic drinks a day. If you find that you have a low bone mass, you may be able to prevent osteoporosis or further bone loss by changing your diet and lifestyle. Where can I find more information? For more information, check out the following websites:  National Osteoporosis Foundation: www.nof.org/patients  National Institutes of Health: www.bones.nih.gov  International Osteoporosis Foundation: www.iofbonehealth.org Summary  The aging process leads to an overall loss of bone mass in the body, which can increase the likelihood of broken bones and osteoporosis.  Eating a well-balanced diet with plenty of calcium and vitamin D will help to protect your bones.  Weight-bearing and strength-building activities are also important for building and maintaining strong bones.  Bone mass can be measured with an X-ray test called a bone mineral density (BMD) test. This information is not intended to replace advice given to you by your health care provider. Make sure you discuss any questions you have with your health care provider. Document Revised: 05/29/2017 Document Reviewed: 05/29/2017 Elsevier Patient Education  2020 Elsevier Inc.   

## 2020-04-17 NOTE — Progress Notes (Signed)
PT WANTS A PAP DONE AND LABS. PT IS HERE FOR A RECTASIL

## 2020-04-17 NOTE — Progress Notes (Signed)
Gynecology Annual Exam  PCP: [facility]  Chief Complaint:  Chief Complaint  Patient presents with  . Gynecologic Exam    History of Present Illness: Patient is a 69 y.o. presents for annual exam. She also desires consultation regarding possible prolapse. She notes she has been experiencing symptoms of a bothersome bulge with some limitation of activity.   The patient is not sexually active.  She is postmenopausal.  She denies postmeopausal bleeding.   Last pap: uncertain Last mammogram: 2013 [facility] 1    Review of Systems: Review of Systems  Constitutional: Negative for chills, fever, malaise/fatigue and weight loss.  HENT: Negative for congestion, hearing loss and sinus pain.   Eyes: Negative for blurred vision and double vision.  Respiratory: Negative for cough, sputum production, shortness of breath and wheezing.   Cardiovascular: Negative for chest pain, palpitations, orthopnea and leg swelling.  Gastrointestinal: Negative for abdominal pain, constipation, diarrhea, nausea and vomiting.  Genitourinary: Negative for dysuria, flank pain, frequency, hematuria and urgency.  Musculoskeletal: Negative for back pain, falls and joint pain.  Skin: Negative for itching and rash.  Neurological: Negative for dizziness and headaches.  Psychiatric/Behavioral: Negative for depression, substance abuse and suicidal ideas. The patient is not nervous/anxious.     Past Medical History:  Past Medical History:  Diagnosis Date  . GERD (gastroesophageal reflux disease)   . H. pylori infection    BREATH TEST NEGATIVE FOLLOWING ABX.    Past Surgical History:  Past Surgical History:  Procedure Laterality Date  . ADJUSTABLE SUTURE MANIPULATION Right 02/19/2016   Procedure: ADJUSTABLE SUTURE MANIPULATION;  Surgeon: Everitt Amber, MD;  Location: [place];  Service: Ophthalmology;  Laterality: Right;  . CATARACT EXTRACTION, BILATERAL    .  COLONOSCOPY N/A 04/04/2017   Procedure: COLONOSCOPY;  Surgeon: Danie Binder, MD;  [facility];  Service: Endoscopy;  Laterality: N/A;  12:45pm - LM for pt to arrive at 12:15  . COLONOSCOPY WITH PROPOFOL N/A 05/25/2020   Procedure: COLONOSCOPY WITH PROPOFOL;  Surgeon: Eloise Harman, DO;  [facility];  Service: Endoscopy;  Laterality: N/A;  1:30 ASA II  . POLYPECTOMY  05/25/2020   Procedure: POLYPECTOMY;  Surgeon: Eloise Harman, DO;  [facility];  Service: Endoscopy;;  . STRABISMUS SURGERY Right 02/19/2016   Procedure: REPAIR STRABISMUS;  Surgeon: Everitt Amber, MD;  Location: [place];  Service: Ophthalmology;  Laterality: Right;    Gynecologic History:  No LMP recorded. Patient is postmenopausal. Contraception: none  Obstetric History: No obstetric history on file.  Family History:  Family History  Problem Relation Age of Onset  . Brain cancer Mother 63  . Heart failure Father 39  . Colon cancer Sister 49  . Colon polyps Neg Hx     Social History:  Social History   Socioeconomic History  . Marital status: Married    Spouse name: Not on file  . Number of children: Not on file  . Years of education: Not on file  . Highest education level: Not on file  Occupational History  . Not on file  Tobacco Use  . Smoking status: Never Smoker  . Smokeless tobacco: Never Used  Vaping Use  . Vaping Use: Never used  Substance and Sexual Activity  . Alcohol use: No  . Drug use: No  . Sexual activity: Not on file  Other Topics Concern  . Not on file  Social History Narrative  2 KIDS: AGE 39 AND 35. 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   HAS SIX SIBLINGS(3 SIS, 2 BRO)   Social Determinants of Health   Financial Resource Strain: Not on file  Food Insecurity: Not on file  Transportation Needs: Not on file  Physical Activity: Not on file  Stress: Not on file  Social  Connections: Not on file  Intimate Partner Violence: Not on file    Allergies:  Allergies  Allergen Reactions  . Latex Rash  . Sulfa Antibiotics Rash    Medications: Prior to Admission medications   Medication Sig Start Date End Date Taking? Authorizing Provider  Multiple Vitamin (MULTIVITAMIN) tablet Take 1 tablet by mouth daily.   Yes [provider]  Omega-3 Fatty Acids (FISH OIL) 1200 MG CAPS Take 1,200 mg daily by mouth.    Yes [provider]  omeprazole (PRILOSEC) 20 MG capsule Take 20 mg by mouth daily.   Yes [provider]  tobramycin-dexamethasone (TOBRADEX) ophthalmic ointment Place 1 application into the right eye 2 (two) times daily. 02/19/16   Everitt Amber, MD    Physical Exam Vitals: Blood pressure 120/70, height 5\' 7"  (1.702 m), weight 157 lb 3.2 oz (71.3 kg).  Physical Exam Constitutional:      Appearance: She is well-developed.  Genitourinary:     Vagina and uterus normal.     There is no lesion on the right labia.     There is no lesion on the left labia.    No lesions in the vagina.     Genitourinary Comments: External: Normal appearing vulva. No lesions noted.  Speculum examination: Normal appearing cervix. No blood in the vaginal vault. Bimanual examination: Uterus midline, non-tender, normal in size, shape and contour.  No CMT. No adnexal masses. No adnexal tenderness. Pelvis not fixed.  Evidence of stage 2 rectocele on pelvic exam with valsalva.       Right Adnexa: no mass present.    Left Adnexa: no mass present.    No cervical motion tenderness.  Breasts:     Right: No inverted nipple, mass, nipple discharge or skin change.     Left: No inverted nipple, mass, nipple discharge or skin change.    HENT:     Head: Normocephalic and atraumatic.  Eyes:     Extraocular Movements: EOM normal.  Neck:     Thyroid: No thyromegaly.  Cardiovascular:     Rate and Rhythm: Normal rate and regular rhythm.     Heart sounds:  Normal heart sounds.  Pulmonary:     Effort: Pulmonary effort is normal.     Breath sounds: Normal breath sounds.  Abdominal:     General: Bowel sounds are normal. There is no distension.     Palpations: Abdomen is soft. There is no mass.  Musculoskeletal:     Cervical back: Neck supple.  Neurological:     Mental Status: She is alert and oriented to person, place, and time.  Skin:    General: Skin is warm and dry.  Psychiatric:        Mood and Affect: Mood and affect normal.        Behavior: Behavior normal.        Thought Content: Thought content normal.        Judgment: Judgment normal.  Vitals reviewed.      Female chaperone present for pelvic and breast  portions of the physical exam  Assessment: 69  y.o. No obstetric history on file. routine annual exam  Plan: Problem List Items Addressed This Visit   None   Visit Diagnoses    Health maintenance examination    -  Primary   Cervical cancer screening       Relevant Orders   Cytology - PAP (Completed)   Breast cancer screening by mammogram       Relevant Orders   MM 3D SCREEN BREAST BILATERAL   Encounter for annual routine gynecological examination       Rectocele          1) Mammogram - recommend yearly screening mammogram.  Mammogram Was ordered today  2) STI screening was offered and declined  3) ASCCP guidelines and rational discussed.  Patient opts for pap today, can consider discontinuing if she qualifies based on pap history. Marland Kitchen   4) Contraception - none needed  5) Colonoscopy -- advised.     6) Routine healthcare maintenance including cholesterol, diabetes screening discussed managed by PCP  7) Rectocele- will return for pessary fitting   Adrian Prows MD, [facility] 06/01/2020 3:12 PM

## 2020-04-22 LAB — CYTOLOGY - PAP
Comment: NEGATIVE
Diagnosis: NEGATIVE
High risk HPV: NEGATIVE

## 2020-04-28 ENCOUNTER — Ambulatory Visit (INDEPENDENT_AMBULATORY_CARE_PROVIDER_SITE_OTHER): Payer: Medicare Other | Admitting: Obstetrics and Gynecology

## 2020-04-28 ENCOUNTER — Encounter: Payer: Self-pay | Admitting: Obstetrics and Gynecology

## 2020-04-28 ENCOUNTER — Other Ambulatory Visit: Payer: Self-pay

## 2020-04-28 VITALS — BP 120/70 | Ht 67.0 in | Wt 156.6 lb

## 2020-04-28 DIAGNOSIS — Z4689 Encounter for fitting and adjustment of other specified devices: Secondary | ICD-10-CM

## 2020-04-28 DIAGNOSIS — N952 Postmenopausal atrophic vaginitis: Secondary | ICD-10-CM | POA: Diagnosis not present

## 2020-04-28 DIAGNOSIS — N816 Rectocele: Secondary | ICD-10-CM

## 2020-04-28 MED ORDER — ESTROGENS, CONJUGATED 0.625 MG/GM VA CREA: TOPICAL_CREAM | VAGINAL | 6 refills | Status: DC

## 2020-04-28 NOTE — Progress Notes (Signed)
Pessary Fitting Patient presents for a pessary fitting. She desires a pessary as her means of controlling her symptoms of prolapse and/or urinary incontinence. She understands the care needed for a pessary and desires to proceed. Alternative treatment options have been discussed at length and the patient voices an understanding of each option.   PROCEDURE: The patient was placed in dorsal lithotomy position. Examination confirmed prolapse. A 3 ring without a knob and without support pessary was fitted without difficulty. The patient subsequently ambulated, voided and performed valsalva maneuvers without dislodging the pessary and without discomfort. Care instructions were provided. Patient was discharged to home in stable condition.    Adrian Prows MD, [facility], [facility] 04/28/2020 10:27 AM

## 2020-04-28 NOTE — Progress Notes (Signed)
Pessary fitting 

## 2020-04-29 ENCOUNTER — Ambulatory Visit (INDEPENDENT_AMBULATORY_CARE_PROVIDER_SITE_OTHER): Payer: Self-pay | Admitting: *Deleted

## 2020-04-29 VITALS — Ht 67.0 in | Wt 157.8 lb

## 2020-04-29 DIAGNOSIS — Z86010 Personal history of colonic polyps: Secondary | ICD-10-CM

## 2020-04-29 MED ORDER — NA SULFATE-K SULFATE-MG SULF 17.5-3.13-1.6 GM/177ML PO SOLN: 1.0000 | Freq: Once | ORAL | 0 refills | Status: AC

## 2020-04-29 NOTE — Progress Notes (Addendum)
Pt wanted to make [place] aware that she has a rectocele that is being treated by OB/GYN.  She is going to have a pessary placed 2-4 weeks from now.  Pt wanted to make [place] aware that she has hx of hemorrhoids.

## 2020-04-29 NOTE — Patient Instructions (Addendum)
Kim Mann  [birth year] MRN: [id number]     Procedure Date: 05/25/2020 Time to register:  12:00 pm Place to register: [facility] Procedure Time: 1:30 pm Scheduled provider: Dr. Abbey Chatters    PREPARATION FOR COLONOSCOPY WITH SUPREP BOWEL PREP KIT  Note: Suprep Bowel Prep Kit is a split-dose (2day) regimen. Consumption of BOTH 6-ounce bottles is required for a complete prep.  Please notify us immediately if you are diabetic, take iron supplements, or if you are on Coumadin or any other blood thinners.  Please hold the following medications: n/a                                                                                                                                                  2 DAYS BEFORE PROCEDURE:  DATE: 05/23/2020  DAY: Saturday Begin clear liquid diet AFTER your lunch meal. NO SOLID FOODS after this point.  1 DAY BEFORE PROCEDURE:  DATE: 05/24/2020   DAY: Sunday Continue clear liquids the entire day - NO SOLID FOOD.   Diabetic medications adjustments for today: n/a  At 6:00pm: Complete steps 1 through 4 below, using ONE (1) 6-ounce bottle, before going to bed. Step 1:  Pour ONE (1) 6-ounce bottle of SUPREP liquid into the mixing container.  Step 2:  Add cool drinking water to the 16 ounce line on the container and mix.  Note: Dilute the solution concentrate as directed prior to use. Step 3:  DRINK ALL the liquid in the container. Step 4:  You MUST drink an additional two (2) or more 16 ounce containers of water over the next one (1) hour.   Continue clear liquids.  DAY OF PROCEDURE:   DATE: 05/25/2020   DAY: Monday If you take medications for your heart, blood pressure, or breathing, you may take these medications.  Diabetic medications adjustments for today: n/a  5 hours before your procedure at :  8:30 am Step 1:  Pour ONE (1) 6-ounce bottle of SUPREP liquid into the mixing container.  Step 2:  Add cool drinking water to the 16 ounce line on the  container and mix.  Note: Dilute the solution concentrate as directed prior to use. Step 3:  DRINK ALL the liquid in the container. Step 4:  You MUST drink an additional two (2) or more 16 ounce containers of water over the next one (1) hour. You MUST complete the final glass of water at least 3 hours before your colonoscopy. Nothing by mouth past 10:30 am.  You may take your morning medications with sip of water unless we have instructed otherwise.    Please see below for Dietary Information.  CLEAR LIQUIDS INCLUDE:  Water Jello (NOT red in color)   Ice Popsicles (NOT red in color)   Tea (sugar ok, no milk/cream) Powdered fruit flavored drinks  Coffee (sugar  ok, no milk/cream) Gatorade/ Lemonade/ Kool-Aid  (NOT red in color)   Juice: apple, white grape, white cranberry Soft drinks  Clear bullion, consomme, broth (fat free beef/chicken/vegetable)  Carbonated beverages (any kind)  Strained chicken noodle soup Hard Candy   Remember: Clear liquids are liquids that will allow you to see your fingers on the other side of a clear glass. Be sure liquids are NOT red in color, and not cloudy, but CLEAR.  DO NOT EAT OR DRINK ANY OF THE FOLLOWING:  Dairy products of any kind   Cranberry juice Tomato juice / V8 juice   Grapefruit juice Orange juice     Red grape juice  Do not eat any solid foods, including such foods as: cereal, oatmeal, yogurt, fruits, vegetables, creamed soups, eggs, bread, crackers, pureed foods in a blender, etc.   HELPFUL HINTS FOR DRINKING PREP SOLUTION:   Make sure prep is extremely cold. Mix and refrigerate the the morning of the prep. You may also put in the freezer.   You may try mixing some Crystal Light or Country Time Lemonade if you prefer. Mix in small amounts; add more if necessary.  Try drinking through a straw  Rinse mouth with water or a mouthwash between glasses, to remove after-taste.  Try sipping on a cold beverage /ice/ popsicles between glasses of  prep.  Place a piece of sugar-free hard candy in mouth between glasses.  If you become nauseated, try consuming smaller amounts, or stretch out the time between glasses. Stop for 30-60 minutes, then slowly start back drinking.     OTHER INSTRUCTIONS  You will need a responsible adult at least 70 years of age to accompany you and drive you home. This person must remain in the waiting room during your procedure. The hospital will cancel your procedure if you do not have a responsible adult with you.   1. Wear loose fitting clothing that is easily removed. 2. Leave jewelry and other valuables at home.  3. Remove all body piercing jewelry and leave at home. 4. Total time from sign-in until discharge is approximately 2-3 hours. 5. You should go home directly after your procedure and rest. You can resume normal activities the day after your procedure. 6. The day of your procedure you should not:  Drive  Make legal decisions  Operate machinery  Drink alcohol  Return to work   You may call the office (Dept: [phone]) before 5:00pm, or page the doctor on call [phone]) after 5:00pm, for further instructions, if necessary.   Insurance Information YOU WILL NEED TO CHECK WITH YOUR INSURANCE COMPANY FOR THE BENEFITS OF COVERAGE YOU HAVE FOR THIS PROCEDURE.  UNFORTUNATELY, NOT ALL INSURANCE COMPANIES HAVE BENEFITS TO COVER ALL OR PART OF THESE TYPES OF PROCEDURES.  IT IS YOUR RESPONSIBILITY TO CHECK YOUR BENEFITS, HOWEVER, WE WILL BE GLAD TO ASSIST YOU WITH ANY CODES YOUR INSURANCE COMPANY MAY NEED.    PLEASE NOTE THAT MOST INSURANCE COMPANIES WILL NOT COVER A SCREENING COLONOSCOPY FOR PEOPLE UNDER THE AGE OF 50  IF YOU HAVE BCBS INSURANCE, YOU MAY HAVE BENEFITS FOR A SCREENING COLONOSCOPY BUT IF POLYPS ARE FOUND THE DIAGNOSIS WILL CHANGE AND THEN YOU MAY HAVE A DEDUCTIBLE THAT WILL NEED TO BE MET. SO PLEASE MAKE SURE YOU CHECK YOUR BENEFITS FOR A SCREENING COLONOSCOPY AS WELL AS A  DIAGNOSTIC COLONOSCOPY.

## 2020-04-29 NOTE — Progress Notes (Signed)
Gastroenterology Pre-Procedure Review  Request Date: 04/29/2020 Requesting Physician: 69 year recall, Last TCS 04/04/2017 done by Dr. Oneida Alar, tubular adenoma (x5), one hyperplastic polyp, family hx of colon cancer (sister)  PATIENT REVIEW QUESTIONS: The patient responded to the following health history questions as indicated:    1. Diabetes Melitis: no 2. Joint replacements in the past 12 months: no 3. Major health problems in the past 3 months: no 4. Has an artificial valve or MVP: no 5. Has a defibrillator: no 6. Has been advised in past to take antibiotics in advance of a procedure like teeth cleaning: no 7. Family history of colon cancer: yes, sister: age 64 8. Alcohol Use: no 9. Illicit drug Use: no 10. History of sleep apnea: no 11. History of coronary artery or other vascular stents placed within the last 12 months: no 12. History of any prior anesthesia complications: yes, hard to wake up 13. Body mass index is 24.71 kg/m.    MEDICATIONS & ALLERGIES:    Patient reports the following regarding taking any blood thinners:   Plavix? no Aspirin? no Coumadin? no Brilinta? no Xarelto? no Eliquis? no Pradaxa? no Savaysa? no Effient? no  Patient confirms/reports the following medications:  Current Outpatient Medications  Medication Sig Dispense Refill  . Cholecalciferol (VITAMIN D3) 50 MCG (2000 UT) TABS Take by mouth daily.    Marland Kitchen conjugated estrogens (PREMARIN) vaginal cream 1 gram vaginally nightly at bedtime for 4 weeks, 1 gram vaginally every other night at bedtime for 4 weeks, then 1 gram vaginally once weekly 30 g 6  . omeprazole (PRILOSEC) 20 MG capsule Take 20 mg by mouth daily.     No current facility-administered medications for this visit.    Patient confirms/reports the following allergies:  Allergies  Allergen Reactions  . Latex Rash  . Sulfa Antibiotics Rash    No orders of the defined types were placed in this encounter.   AUTHORIZATION  INFORMATION Primary Insurance: Medicare,  ID #: [id number] Pre-Cert / Auth required: No, not required  Secondary Insurance: BCBS Supplement,  ID #: [id number],  Group #: [id number] Pre-Cert / Auth required: No, not required  SCHEDULE INFORMATION: Procedure has been scheduled as follows:  Date: 05/25/2020, Time: 1:30 Location: [facility] with Dr. Abbey Chatters  This Gastroenterology Pre-Precedure Review Form is being routed to the following provider(s): Aliene Altes, PA

## 2020-04-30 ENCOUNTER — Encounter: Payer: Self-pay | Admitting: Gastroenterology

## 2020-04-30 NOTE — Progress Notes (Signed)
OK to schedule. ASA II 

## 2020-05-07 ENCOUNTER — Telehealth: Payer: Self-pay | Admitting: Obstetrics and Gynecology

## 2020-05-07 NOTE — Telephone Encounter (Signed)
Called and left voicemail for patient to call back to be scheduled. 

## 2020-05-07 NOTE — Telephone Encounter (Signed)
-----   Message from [place], LPN sent at [id number]/[phone] 11:48 AM EST ----- Regarding: pessary Pessary available. Please schedule apt for placement at patient convenience. ----- Message ----- From: Homero Fellers, MD Sent: 04/28/2020  11:24 AM EST To: Otila Kluver, LPN  Ring 3 pessary- please schedule follow up when it has arrived

## 2020-05-22 ENCOUNTER — Other Ambulatory Visit: Payer: Self-pay

## 2020-05-22 ENCOUNTER — Other Ambulatory Visit (HOSPITAL_COMMUNITY)
Admission: RE | Admit: 2020-05-22 | Discharge: 2020-05-22 | Disposition: A | Discharge status: Home / Self Care | Payer: Medicare Other | Source: Ambulatory Visit | Attending: Internal Medicine | Admitting: Internal Medicine

## 2020-05-22 DIAGNOSIS — Z20822 Contact with and (suspected) exposure to covid-19: Secondary | ICD-10-CM | POA: Diagnosis not present

## 2020-05-22 DIAGNOSIS — Z01812 Encounter for preprocedural laboratory examination: Secondary | ICD-10-CM | POA: Insufficient documentation

## 2020-05-23 LAB — SARS CORONAVIRUS 2 (TAT 6-24 HRS): SARS Coronavirus 2: NEGATIVE

## 2020-05-25 ENCOUNTER — Ambulatory Visit (HOSPITAL_COMMUNITY): Payer: Medicare Other | Admitting: Anesthesiology

## 2020-05-25 ENCOUNTER — Other Ambulatory Visit: Payer: Self-pay

## 2020-05-25 ENCOUNTER — Encounter (HOSPITAL_COMMUNITY)
Admission: RE | Disposition: A | Discharge status: Home / Self Care | Payer: Self-pay | Source: Home / Self Care | Attending: Internal Medicine

## 2020-05-25 ENCOUNTER — Ambulatory Visit (HOSPITAL_COMMUNITY)
Admission: RE | Admit: 2020-05-25 | Discharge: 2020-05-25 | Disposition: A | Discharge status: Home / Self Care | Payer: Medicare Other | Attending: Internal Medicine | Admitting: Internal Medicine

## 2020-05-25 ENCOUNTER — Encounter (HOSPITAL_COMMUNITY): Payer: Self-pay

## 2020-05-25 DIAGNOSIS — Z86010 Personal history of colonic polyps: Secondary | ICD-10-CM | POA: Insufficient documentation

## 2020-05-25 DIAGNOSIS — K635 Polyp of colon: Secondary | ICD-10-CM | POA: Diagnosis not present

## 2020-05-25 DIAGNOSIS — K648 Other hemorrhoids: Secondary | ICD-10-CM | POA: Diagnosis not present

## 2020-05-25 DIAGNOSIS — Z1211 Encounter for screening for malignant neoplasm of colon: Secondary | ICD-10-CM | POA: Insufficient documentation

## 2020-05-25 DIAGNOSIS — Z882 Allergy status to sulfonamides status: Secondary | ICD-10-CM | POA: Insufficient documentation

## 2020-05-25 DIAGNOSIS — Z79899 Other long term (current) drug therapy: Secondary | ICD-10-CM | POA: Insufficient documentation

## 2020-05-25 DIAGNOSIS — K5730 Diverticulosis of large intestine without perforation or abscess without bleeding: Secondary | ICD-10-CM | POA: Diagnosis not present

## 2020-05-25 DIAGNOSIS — Z800 Family history of malignant neoplasm of digestive organs: Secondary | ICD-10-CM | POA: Diagnosis not present

## 2020-05-25 DIAGNOSIS — Z91040 Latex allergy status: Secondary | ICD-10-CM | POA: Insufficient documentation

## 2020-05-25 HISTORY — PX: COLONOSCOPY WITH PROPOFOL: SHX5780

## 2020-05-25 HISTORY — PX: POLYPECTOMY: SHX5525

## 2020-05-25 SURGERY — COLONOSCOPY WITH PROPOFOL
Anesthesia: General

## 2020-05-25 MED ORDER — STERILE WATER FOR IRRIGATION IR SOLN
Status: DC | PRN
  Administered 2020-05-25: 1.5 mL

## 2020-05-25 MED ORDER — LACTATED RINGERS IV SOLN
Freq: Once | INTRAVENOUS | Status: AC
  Administered 2020-05-25: 1000 mL via INTRAVENOUS

## 2020-05-25 MED ORDER — OMEPRAZOLE 20 MG PO CPDR: 20.0000 mg | DELAYED_RELEASE_CAPSULE | Freq: Two times a day (BID) | ORAL | 5 refills | Status: DC

## 2020-05-25 MED ORDER — PROPOFOL 500 MG/50ML IV EMUL
INTRAVENOUS | Status: DC | PRN
  Administered 2020-05-25: 150 ug/kg/min via INTRAVENOUS

## 2020-05-25 MED ORDER — LACTATED RINGERS IV SOLN: INTRAVENOUS | Status: DC | PRN

## 2020-05-25 MED ORDER — PROPOFOL 10 MG/ML IV BOLUS
INTRAVENOUS | Status: DC | PRN
  Administered 2020-05-25: 20 mg via INTRAVENOUS
  Administered 2020-05-25: 50 mg via INTRAVENOUS

## 2020-05-25 NOTE — Op Note (Signed)
[facility] Patient Name: Kim Mann Procedure Date: 05/25/2020 1:08 PM MRN: [id number] Date of Birth: 1950/12/19 Attending MD: Elon Alas. Edgar Frisk CSN: [id number] Age: 70 Admit Type: Outpatient Procedure:                Colonoscopy Indications:              High risk colon cancer surveillance: Personal                            history of colonic polyps Providers:                Elon Alas. Abbey Chatters, DO, Tacy Learn,                            Technician Referring MD:              Medicines:                See the Anesthesia note for documentation of the                            administered medications Complications:            No immediate complications. Estimated Blood Loss:     Estimated blood loss was minimal. Procedure:                Pre-Anesthesia Assessment:                           - The anesthesia plan was to use monitored                            anesthesia care (MAC).                           After obtaining informed consent, the colonoscope                            was passed under direct vision. Throughout the                            procedure, the patient's blood pressure, pulse, and                            oxygen saturations were monitored continuously. The                            [id number]) was introduced through the anus                            and advanced to the the cecum, identified by                            appendiceal orifice and ileocecal valve. The                            colonoscopy was performed without difficulty. The  patient tolerated the procedure well. The quality                            of the bowel preparation was evaluated using the                            BBPS [facility] Bowel Preparation Scale) with scores                            of: Right Colon = 2 (minor amount of residual                            staining, small fragments of stool and/or opaque                             liquid, but mucosa seen well), Transverse Colon = 3                            (entire mucosa seen well with no residual staining,                            small fragments of stool or opaque liquid) and Left                            Colon = 3 (entire mucosa seen well with no residual                            staining, small fragments of stool or opaque                            liquid). The total BBPS score equals 8. The quality                            of the bowel preparation was good. Scope In: 1:26:09 PM Scope Out: 1:42:24 PM Scope Withdrawal Time: 0 hours 9 minutes 49 seconds  Total Procedure Duration: 0 hours 16 minutes 15 seconds  Findings:      The perianal and digital rectal examinations were normal.      Non-bleeding internal hemorrhoids were found during retroflexion.      Many small and large-mouthed diverticula were found in the sigmoid       colon, descending colon and transverse colon.      A 3 mm polyp was found in the sigmoid colon. The polyp was sessile. The       polyp was removed with a cold snare. Resection and retrieval were       complete.      A 10 mm polyp was found in the transverse colon. The polyp was sessile.       The polyp was removed with a cold snare. Resection and retrieval were       complete. Impression:               - Non-bleeding internal hemorrhoids.                           -  Diverticulosis in the sigmoid colon, in the                            descending colon and in the transverse colon.                           - One 3 mm polyp in the sigmoid colon, removed with                            a cold snare. Resected and retrieved.                           - One 10 mm polyp in the transverse colon, removed                            with a cold snare. Resected and retrieved. Moderate Sedation:      Per Anesthesia Care Recommendation:           - Patient has a contact number available for                             emergencies. The signs and symptoms of potential                            delayed complications were discussed with the                            patient. Return to normal activities tomorrow.                            Written discharge instructions were provided to the                            patient.                           - Resume previous diet.                           - Continue present medications.                           - Await pathology results.                           - Repeat colonoscopy in 3 years for surveillance.                           - Return to GI clinic in 3 months with Roseanne Kaufman                            to discuss hemorrhoid banding as well as reflux. Procedure Code(s):        --- Professional ---  45385, Colonoscopy, flexible; with removal of                            tumor(s), polyp(s), or other lesion(s) by snare                            technique Diagnosis Code(s):        --- Professional ---                           Z86.010, Personal history of colonic polyps                           K64.8, Other hemorrhoids                           K63.5, Polyp of colon                           K57.30, Diverticulosis of large intestine without                            perforation or abscess without bleeding CPT copyright 2019 American Medical Association. All rights reserved. The codes documented in this report are preliminary and upon coder review may  be revised to meet current compliance requirements. Elon Alas. Abbey Chatters, DO [place] Abbey Chatters, DO 05/25/2020 1:47:43 PM This report has been signed electronically. Number of Addenda: 0

## 2020-05-25 NOTE — Transfer of Care (Signed)
Immediate Anesthesia Transfer of Care Note  Patient: Kim Mann  Procedure(s) Performed: COLONOSCOPY WITH PROPOFOL (N/A ) POLYPECTOMY  Patient Location: PACU  Anesthesia Type:General  Level of Consciousness: awake  Airway & Oxygen Therapy: Patient Spontanous Breathing  Post-op Assessment: Report given to RN  Post vital signs: Reviewed and stable  Last Vitals:  Vitals Value Taken Time  BP 118/63 05/25/20 1347  Temp 36.6 C 05/25/20 1347  Pulse    Resp 16 05/25/20 1347  SpO2 98 % 05/25/20 1347    Last Pain:  Vitals:   05/25/20 1347  TempSrc: Oral  PainSc: 3       Patients Stated Pain Goal: 6 (78/29/56 2130)  Complications: No complications documented.

## 2020-05-25 NOTE — H&P (Signed)
Primary Care Physician:  [facility]:  Dr. Abbey Chatters  Pre-Procedure History & Physical: HPI:  LARINA LIEURANCE is a 70 y.o. female is here for a colonoscopy to be performed for surveillance due to history of adenomatous colon polyps.   Patient also with a family history of colon cancer (sister).  No melena or hematochezia.  No abdominal pain or unintentional weight loss.  No change in bowel habits.  Overall feels well from a GI standpoint.  Past Medical History:  Diagnosis Date  . GERD (gastroesophageal reflux disease)   . H. pylori infection    BREATH TEST NEGATIVE FOLLOWING ABX.    Past Surgical History:  Procedure Laterality Date  . ADJUSTABLE SUTURE MANIPULATION Right 02/19/2016   Procedure: ADJUSTABLE SUTURE MANIPULATION;  Surgeon: Everitt Amber, MD;  Location: [place];  Service: Ophthalmology;  Laterality: Right;  . CATARACT EXTRACTION, BILATERAL    . COLONOSCOPY N/A 04/04/2017   Procedure: COLONOSCOPY;  Surgeon: Danie Binder, MD;  [facility];  Service: Endoscopy;  Laterality: N/A;  12:45pm - LM for pt to arrive at 12:15  . STRABISMUS SURGERY Right 02/19/2016   Procedure: REPAIR STRABISMUS;  Surgeon: Everitt Amber, MD;  Location: [place];  Service: Ophthalmology;  Laterality: Right;    Prior to Admission medications   Medication Sig Start Date End Date Taking? Authorizing Provider  Cholecalciferol (VITAMIN D3) 50 MCG (2000 UT) TABS Take 2,000 Units by mouth daily.   Yes [provider]  loratadine (CLARITIN) 10 MG tablet Take 10 mg by mouth daily.   Yes [provider]  omeprazole (PRILOSEC) 20 MG capsule Take 20 mg by mouth daily.   Yes [provider]  conjugated estrogens (PREMARIN) vaginal cream 1 gram vaginally nightly at bedtime for 4 weeks, 1 gram vaginally every other night at bedtime for 4 weeks, then 1 gram vaginally once weekly Patient not  taking: Reported on 05/19/2020 04/28/20   Homero Fellers, MD    Allergies as of 04/30/2020 - Review Complete 04/29/2020  Allergen Reaction Noted  . Latex Rash 04/29/2020  . Sulfa antibiotics Rash 02/15/2016    Family History  Problem Relation Age of Onset  . Brain cancer Mother 59  . Heart failure Father 54  . Colon cancer Sister 32  . Colon polyps Neg Hx     Social History   Socioeconomic History  . Marital status: Married    Spouse name: Not on file  . Number of children: Not on file  . Years of education: Not on file  . Highest education level: Not on file  Occupational History  . Not on file  Tobacco Use  . Smoking status: Never Smoker  . Smokeless tobacco: Never Used  Vaping Use  . Vaping Use: Never used  Substance and Sexual Activity  . Alcohol use: No  . Drug use: No  . Sexual activity: Not on file  Other Topics Concern  . Not on file  Social History Narrative   2 KIDS: AGE 41 AND 35. 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   HAS SIX SIBLINGS(3 SIS, 2 BRO)   Social Determinants of Health   Financial Resource Strain: Not on file  Food Insecurity: Not on file  Transportation Needs: Not on file  Physical Activity: Not on file  Stress: Not on file  Social Connections: Not on file  Intimate Partner Violence: Not on  file    Review of Systems: See HPI, otherwise negative ROS  Impression/Plan: NYAH SHEPHERD is here for a colonoscopy to be performed for surveillance due to history of adenomatous colon polyps.   The risks of the procedure including infection, bleed, or perforation as well as benefits, limitations, alternatives and imponderables have been reviewed with the patient. Questions have been answered. All parties agreeable.

## 2020-05-25 NOTE — Anesthesia Preprocedure Evaluation (Signed)
Anesthesia Evaluation  Patient identified by MRN, date of birth, ID band Patient awake    Reviewed: Allergy & Precautions, NPO status , Patient's Chart, lab work & pertinent test results  History of Anesthesia Complications Negative for: history of anesthetic complications  Airway Mallampati: II  TM Distance: >3 FB Neck ROM: Full    Dental  (+) Dental Advisory Given, Caps,    Pulmonary neg pulmonary ROS,    Pulmonary exam normal breath sounds clear to auscultation       Cardiovascular Exercise Tolerance: Good Normal cardiovascular exam Rhythm:Regular Rate:Normal     Neuro/Psych  Neuromuscular disease negative psych ROS   GI/Hepatic Neg liver ROS, GERD  Medicated and Controlled,  Endo/Other  negative endocrine ROS  Renal/GU negative Renal ROS     Musculoskeletal negative musculoskeletal ROS (+)   Abdominal   Peds  Hematology negative hematology ROS (+)   Anesthesia Other Findings   Reproductive/Obstetrics negative OB ROS                             Anesthesia Physical Anesthesia Plan  ASA: II  Anesthesia Plan: General   Post-op Pain Management:    Induction: Intravenous  PONV Risk Score and Plan: TIVA  Airway Management Planned: Natural Airway and Nasal Cannula  Additional Equipment:   Intra-op Plan:   Post-operative Plan:   Informed Consent: I have reviewed the patients History and Physical, chart, labs and discussed the procedure including the risks, benefits and alternatives for the proposed anesthesia with the patient or authorized representative who has indicated his/her understanding and acceptance.     Dental advisory given  Plan Discussed with: CRNA and Surgeon  Anesthesia Plan Comments:         Anesthesia Quick Evaluation

## 2020-05-25 NOTE — Anesthesia Postprocedure Evaluation (Signed)
Anesthesia Post Note  Patient: Kim Mann  Procedure(s) Performed: COLONOSCOPY WITH PROPOFOL (N/A ) POLYPECTOMY  Patient location during evaluation: Endoscopy Anesthesia Type: General Level of consciousness: awake and alert Pain management: pain level controlled Vital Signs Assessment: post-procedure vital signs reviewed and stable Respiratory status: spontaneous breathing Cardiovascular status: blood pressure returned to baseline and stable Postop Assessment: no apparent nausea or vomiting Anesthetic complications: no   No complications documented.   Last Vitals:  Vitals:   05/25/20 1227 05/25/20 1347  BP: (!) 175/86 118/63  Pulse: 92   Resp: 13 16  Temp: 36.6 C 36.6 C  SpO2: 97% 98%    Last Pain:  Vitals:   05/25/20 1347  TempSrc: Oral  PainSc: 3                  DANIEL,KAREN

## 2020-05-25 NOTE — Discharge Instructions (Addendum)
Colonoscopy Discharge Instructions  Read the instructions outlined below and refer to this sheet in the next few weeks. These discharge instructions provide you with general information on caring for yourself after you leave the hospital. Your doctor may also give you specific instructions. While your treatment has been planned according to the most current medical practices available, unavoidable complications occasionally occur.   ACTIVITY  You may resume your regular activity, but move at a slower pace for the next 24 hours.   Take frequent rest periods for the next 24 hours.   Walking will help get rid of the air and reduce the bloated feeling in your belly (abdomen).   No driving for 24 hours (because of the medicine (anesthesia) used during the test).    Do not sign any important legal documents or operate any machinery for 24 hours (because of the anesthesia used during the test).  NUTRITION  Drink plenty of fluids.   You may resume your normal diet as instructed by your doctor.   Begin with a light meal and progress to your normal diet. Heavy or fried foods are harder to digest and may make you feel sick to your stomach (nauseated).   Avoid alcoholic beverages for 24 hours or as instructed.  MEDICATIONS  You may resume your normal medications unless your doctor tells you otherwise.  WHAT YOU CAN EXPECT TODAY  Some feelings of bloating in the abdomen.   Passage of more gas than usual.   Spotting of blood in your stool or on the toilet paper.  IF YOU HAD POLYPS REMOVED DURING THE COLONOSCOPY:  No aspirin products for 7 days or as instructed.   No alcohol for 7 days or as instructed.   Eat a soft diet for the next 24 hours.  FINDING OUT THE RESULTS OF YOUR TEST Not all test results are available during your visit. If your test results are not back during the visit, make an appointment with your caregiver to find out the results. Do not assume everything is normal if  you have not heard from your caregiver or the medical facility. It is important for you to follow up on all of your test results.  SEEK IMMEDIATE MEDICAL ATTENTION IF:  You have more than a spotting of blood in your stool.   Your belly is swollen (abdominal distention).   You are nauseated or vomiting.   You have a temperature over 101.   You have abdominal pain or discomfort that is severe or gets worse throughout the day.   Your colonoscopy revealed 2 polyp(s) which I removed successfully. Await pathology results, my office will contact you. I recommend repeating colonoscopy in 3 years for surveillance purposes. You also have diverticulosis and internal hemorrhoids. I would recommend increasing fiber in your diet or adding OTC Benefiber/Metamucil. Be sure to drink at least 4 to 6 glasses of water daily. Follow-up with GI in 2-3 months to discuss hemorrhoid banding and chronic reflux. Increase your Omeprazole to 20 mg twice daily until that time.   I hope you have a great rest of your week!  Elon Alas. Abbey Chatters, D.O. Gastroenterology and Hepatology [facility]   Hemorrhoids Hemorrhoids are swollen veins in and around the rectum or anus. There are two types of hemorrhoids:  Internal hemorrhoids. These occur in the veins that are just inside the rectum. They may poke through to the outside and become irritated and painful.  External hemorrhoids. These occur in the veins that are outside  the anus and can be felt as a painful swelling or hard lump near the anus. Most hemorrhoids do not cause serious problems, and they can be managed with home treatments such as diet and lifestyle changes. If home treatments do not help the symptoms, procedures can be done to shrink or remove the hemorrhoids. What are the causes? This condition is caused by increased pressure in the anal area. This pressure may result from various things, including:  Constipation.  Straining to  have a bowel movement.  Diarrhea.  Pregnancy.  Obesity.  Sitting for long periods of time.  Heavy lifting or other activity that causes you to strain.  Anal sex.  Riding a bike for a long period of time. What are the signs or symptoms? Symptoms of this condition include:  Pain.  Anal itching or irritation.  Rectal bleeding.  Leakage of stool (feces).  Anal swelling.  One or more lumps around the anus. How is this diagnosed? This condition can often be diagnosed through a visual exam. Other exams or tests may also be done, such as:  An exam that involves feeling the rectal area with a gloved hand (digital rectal exam).  An exam of the anal canal that is done using a small tube (anoscope).  A blood test, if you have lost a significant amount of blood.  A test to look inside the colon using a flexible tube with a camera on the end (sigmoidoscopy or colonoscopy). How is this treated? This condition can usually be treated at home. However, various procedures may be done if dietary changes, lifestyle changes, and other home treatments do not help your symptoms. These procedures can help make the hemorrhoids smaller or remove them completely. Some of these procedures involve surgery, and others do not. Common procedures include:  Rubber band ligation. Rubber bands are placed at the base of the hemorrhoids to cut off their blood supply.  Sclerotherapy. Medicine is injected into the hemorrhoids to shrink them.  Infrared coagulation. A type of light energy is used to get rid of the hemorrhoids.  Hemorrhoidectomy surgery. The hemorrhoids are surgically removed, and the veins that supply them are tied off.  Stapled hemorrhoidopexy surgery. The surgeon staples the base of the hemorrhoid to the rectal wall. Follow these instructions at home: Eating and drinking  Eat foods that have a lot of fiber in them, such as whole grains, beans, nuts, fruits, and vegetables.  Ask your  health care provider about taking products that have added fiber (fiber supplements).  Reduce the amount of fat in your diet. You can do this by eating low-fat dairy products, eating less red meat, and avoiding processed foods.  Drink enough fluid to keep your urine pale yellow.   Managing pain and swelling  Take warm sitz baths for 20 minutes, 3-4 times a day to ease pain and discomfort. You may do this in a bathtub or using a portable sitz bath that fits over the toilet.  If directed, apply ice to the affected area. Using ice packs between sitz baths may be helpful. ? Put ice in a plastic bag. ? Place a towel between your skin and the bag. ? Leave the ice on for 20 minutes, 2-3 times a day.   General instructions  Take over-the-counter and prescription medicines only as told by your health care provider.  Use medicated creams or suppositories as told.  Get regular exercise. Ask your health care provider how much and what kind of exercise is best  for you. In general, you should do moderate exercise for at least 30 minutes on most days of the week (150 minutes each week). This can include activities such as walking, biking, or yoga.  Go to the bathroom when you have the urge to have a bowel movement. Do not wait.  Avoid straining to have bowel movements.  Keep the anal area dry and clean. Use wet toilet paper or moist towelettes after a bowel movement.  Do not sit on the toilet for long periods of time. This increases blood pooling and pain.  Keep all follow-up visits as told by your health care provider. This is important. Contact a health care provider if you have:  Increasing pain and swelling that are not controlled by treatment or medicine.  Difficulty having a bowel movement, or you are unable to have a bowel movement.  Pain or inflammation outside the area of the hemorrhoids. Get help right away if you have:  Uncontrolled bleeding from your  rectum. Summary  Hemorrhoids are swollen veins in and around the rectum or anus.  Most hemorrhoids can be managed with home treatments such as diet and lifestyle changes.  Taking warm sitz baths can help ease pain and discomfort.  In severe cases, procedures or surgery can be done to shrink or remove the hemorrhoids. This information is not intended to replace advice given to you by your health care provider. Make sure you discuss any questions you have with your health care provider. Document Revised: 09/28/2018 Document Reviewed: 09/21/2017 Elsevier Patient Education  2021 [place].  Diverticulosis  Diverticulosis is a condition that develops when small pouches (diverticula) form in the wall of the large intestine (colon). The colon is where water is absorbed and stool (feces) is formed. The pouches form when the inside layer of the colon pushes through weak spots in the outer layers of the colon. You may have a few pouches or many of them. The pouches usually do not cause problems unless they become inflamed or infected. When this happens, the condition is called diverticulitis. What are the causes? The cause of this condition is not known. What increases the risk? The following factors may make you more likely to develop this condition:  Being older than age 64. Your risk for this condition increases with age. Diverticulosis is rare among people younger than age 110. By age 60, many people have it.  Eating a low-fiber diet.  Having frequent constipation.  Being overweight.  Not getting enough exercise.  Smoking.  Taking over-the-counter pain medicines, like aspirin and ibuprofen.  Having a family history of diverticulosis. What are the signs or symptoms? In most people, there are no symptoms of this condition. If you do have symptoms, they may include:  Bloating.  Cramps in the abdomen.  Constipation or diarrhea.  Pain in the lower left side of the abdomen. How  is this diagnosed? Because diverticulosis usually has no symptoms, it is most often diagnosed during an exam for other colon problems. The condition may be diagnosed by:  Using a flexible scope to examine the colon (colonoscopy).  Taking an X-ray of the colon after dye has been put into the colon (barium enema).  Having a CT scan. How is this treated? You may not need treatment for this condition. Your health care provider may recommend treatment to prevent problems. You may need treatment if you have symptoms or if you previously had diverticulitis. Treatment may include:  Eating a high-fiber diet.  Taking a  fiber supplement.  Taking a live bacteria supplement (probiotic).  Taking medicine to relax your colon.   Follow these instructions at home: Medicines  Take over-the-counter and prescription medicines only as told by your health care provider.  If told by your health care provider, take a fiber supplement or probiotic. Constipation prevention Your condition may cause constipation. To prevent or treat constipation, you may need to:  Drink enough fluid to keep your urine pale yellow.  Take over-the-counter or prescription medicines.  Eat foods that are high in fiber, such as beans, whole grains, and fresh fruits and vegetables.  Limit foods that are high in fat and processed sugars, such as fried or sweet foods.   General instructions  Try not to strain when you have a bowel movement.  Keep all follow-up visits as told by your health care provider. This is important. Contact a health care provider if you:  Have pain in your abdomen.  Have bloating.  Have cramps.  Have not had a bowel movement in 3 days. Get help right away if:  Your pain gets worse.  Your bloating becomes very bad.  You have a fever or chills, and your symptoms suddenly get worse.  You vomit.  You have bowel movements that are bloody or black.  You have bleeding from your  rectum. Summary  Diverticulosis is a condition that develops when small pouches (diverticula) form in the wall of the large intestine (colon).  You may have a few pouches or many of them.  This condition is most often diagnosed during an exam for other colon problems.  Treatment may include increasing the fiber in your diet, taking supplements, or taking medicines. This information is not intended to replace advice given to you by your health care provider. Make sure you discuss any questions you have with your health care provider. Document Revised: 11/29/2018 Document Reviewed: 11/29/2018 Elsevier Patient Education  [place].  Colon Polyps  Colon polyps are tissue growths inside the colon, which is part of the large intestine. They are one of the types of polyps that can grow in the body. A polyp may be a round bump or a mushroom-shaped growth. You could have one polyp or more than one. Most colon polyps are noncancerous (benign). However, some colon polyps can become cancerous over time. Finding and removing the polyps early can help prevent this. What are the causes? The exact cause of colon polyps is not known. What increases the risk? The following factors may make you more likely to develop this condition:  Having a family history of colorectal cancer or colon polyps.  Being older than 71 years of age.  Being younger than 71 years of age and having a significant family history of colorectal cancer or colon polyps or a genetic condition that puts you at higher risk of getting colon polyps.  Having inflammatory bowel disease, such as ulcerative colitis or Crohn's disease.  Having certain conditions passed from parent to child (hereditary conditions), such as: ? Familial adenomatous polyposis (FAP). ? Lynch syndrome. ? Turcot syndrome. ? Peutz-Jeghers syndrome. ? MUTYH-associated polyposis (MAP).  Being overweight.  Certain lifestyle factors. These include  smoking cigarettes, drinking too much alcohol, not getting enough exercise, and eating a diet that is high in fat and red meat and low in fiber.  Having had childhood cancer that was treated with radiation of the abdomen. What are the signs or symptoms? Many times, there are no symptoms. If you have symptoms, they  may include:  Blood coming from the rectum during a bowel movement.  Blood in the stool (feces). The blood may be bright red or very dark in color.  Pain in the abdomen.  A change in bowel habits, such as constipation or diarrhea. How is this diagnosed? This condition is diagnosed with a colonoscopy. This is a procedure in which a lighted, flexible scope is inserted into the opening between the buttocks (anus) and then passed into the colon to examine the area. Polyps are sometimes found when a colonoscopy is done as part of routine cancer screening tests. How is this treated? This condition is treated by removing any polyps that are found. Most polyps can be removed during a colonoscopy. Those polyps will then be tested for cancer. Additional treatment may be needed depending on the results of testing. Follow these instructions at home: Eating and drinking  Eat foods that are high in fiber, such as fruits, vegetables, and whole grains.  Eat foods that are high in calcium and vitamin D, such as milk, cheese, yogurt, eggs, liver, fish, and broccoli.  Limit foods that are high in fat, such as fried foods and desserts.  Limit the amount of red meat, precooked or cured meat, or other processed meat that you eat, such as hot dogs, sausages, bacon, or meat loaves.  Limit sugary drinks.   Lifestyle  Maintain a healthy weight, or lose weight if recommended by your health care provider.  Exercise every day or as told by your health care provider.  Do not use any products that contain nicotine or tobacco, such as cigarettes, e-cigarettes, and chewing tobacco. If you need help  quitting, ask your health care provider.  Do not drink alcohol if: ? Your health care provider tells you not to drink. ? You are pregnant, may be pregnant, or are planning to become pregnant.  If you drink alcohol: ? Limit how much you use to:  0-1 drink a day for women.  0-2 drinks a day for men. ? Know how much alcohol is in your drink. In the [place] one drink equals one 12 oz bottle of beer (355 mL), one 5 oz glass of wine (148 mL), or one 1 oz glass of hard liquor (44 mL). General instructions  Take over-the-counter and prescription medicines only as told by your health care provider.  Keep all follow-up visits. This is important. This includes having regularly scheduled colonoscopies. Talk to your health care provider about when you need a colonoscopy. Contact a health care provider if:  You have new or worsening bleeding during a bowel movement.  You have new or increased blood in your stool.  You have a change in bowel habits.  You lose weight for no known reason. Summary  Colon polyps are tissue growths inside the colon, which is part of the large intestine. They are one type of polyp that can grow in the body.  Most colon polyps are noncancerous (benign), but some can become cancerous over time.  This condition is diagnosed with a colonoscopy.  This condition is treated by removing any polyps that are found. Most polyps can be removed during a colonoscopy. This information is not intended to replace advice given to you by your health care provider. Make sure you discuss any questions you have with your health care provider. Document Revised: 08/21/2019 Document Reviewed: 08/21/2019 Elsevier Patient Education  2021 [employer].

## 2020-05-26 ENCOUNTER — Telehealth: Payer: Self-pay | Admitting: Obstetrics and Gynecology

## 2020-05-26 LAB — SURGICAL PATHOLOGY

## 2020-05-26 NOTE — Telephone Encounter (Signed)
Pt requesting call back in regards to questions about pessary fitting. Call back number confirmed as [phone]

## 2020-05-27 NOTE — Telephone Encounter (Signed)
Called patient, she did not answer, left a message. I believe Crystal was going to schedule her to come back for her pessary to be placed.

## 2020-05-27 NOTE — Telephone Encounter (Signed)
Please advise this patient 

## 2020-05-28 ENCOUNTER — Encounter (HOSPITAL_COMMUNITY): Payer: Self-pay | Admitting: Internal Medicine

## 2020-05-28 NOTE — Progress Notes (Signed)
Dear Wells Guiles,  The results of your colonoscopy are as follows: the polyp(s) removed were tubular adenoma(s). Non cancerous. You are to repeat your colonoscopy in 3 years and to follow up as previously scheduled.   Thank you, Floria Raveling

## 2020-05-28 NOTE — Telephone Encounter (Signed)
Called and left voicemail for patient to call back to be scheduled. 

## 2020-05-29 NOTE — Telephone Encounter (Signed)
Patient is scheduled for 06/12/20 at 10:50 with [facility]

## 2020-06-12 ENCOUNTER — Other Ambulatory Visit: Payer: Self-pay

## 2020-06-12 ENCOUNTER — Encounter: Payer: Self-pay | Admitting: Obstetrics and Gynecology

## 2020-06-12 ENCOUNTER — Ambulatory Visit (INDEPENDENT_AMBULATORY_CARE_PROVIDER_SITE_OTHER): Payer: Medicare Other | Admitting: Obstetrics and Gynecology

## 2020-06-12 VITALS — BP 136/72 | Ht 67.0 in | Wt 159.0 lb

## 2020-06-12 DIAGNOSIS — N816 Rectocele: Secondary | ICD-10-CM | POA: Diagnosis not present

## 2020-06-12 DIAGNOSIS — Z4689 Encounter for fitting and adjustment of other specified devices: Secondary | ICD-10-CM | POA: Diagnosis not present

## 2020-06-12 NOTE — Progress Notes (Signed)
Patient ID: Kim Mann, female   DOB: 27-Feb-1951, 70 y.o.   MRN: [id number]  Reason for Consult: Gynecologic Exam (Pessary placement)   Referred by [facility]  Subjective:     HPI:  Kim Mann is a 70 y.o. female . She was previously fitted with a size 3 ring pessary. She is here today for placement.    Past Medical History:  Diagnosis Date  . GERD (gastroesophageal reflux disease)   . H. pylori infection    BREATH TEST NEGATIVE FOLLOWING ABX.   Family History  Problem Relation Age of Onset  . Brain cancer Mother 72  . Heart failure Father 65  . Colon cancer Sister 49  . Colon polyps Neg Hx    Past Surgical History:  Procedure Laterality Date  . ADJUSTABLE SUTURE MANIPULATION Right 02/19/2016   Procedure: ADJUSTABLE SUTURE MANIPULATION;  Surgeon: Everitt Amber, MD;  Location: [place];  Service: Ophthalmology;  Laterality: Right;  . CATARACT EXTRACTION, BILATERAL    . COLONOSCOPY N/A 04/04/2017   Procedure: COLONOSCOPY;  Surgeon: Danie Binder, MD;  [facility];  Service: Endoscopy;  Laterality: N/A;  12:45pm - LM for pt to arrive at 12:15  . COLONOSCOPY WITH PROPOFOL N/A 05/25/2020   Procedure: COLONOSCOPY WITH PROPOFOL;  Surgeon: Eloise Harman, DO;  [facility];  Service: Endoscopy;  Laterality: N/A;  1:30 ASA II  . POLYPECTOMY  05/25/2020   Procedure: POLYPECTOMY;  Surgeon: Eloise Harman, DO;  [facility];  Service: Endoscopy;;  . STRABISMUS SURGERY Right 02/19/2016   Procedure: REPAIR STRABISMUS;  Surgeon: Everitt Amber, MD;  Location: [place];  Service: Ophthalmology;  Laterality: Right;    Short Social History:  Social History   Tobacco Use  . Smoking status: Never Smoker  . Smokeless tobacco: Never Used  Substance Use Topics  . Alcohol use: No    Allergies  Allergen Reactions  . Latex Rash  . Sulfa Antibiotics Rash    Current Outpatient Medications   Medication Sig Dispense Refill  . Cholecalciferol (VITAMIN D3) 50 MCG (2000 UT) TABS Take 2,000 Units by mouth daily.    Marland Kitchen loratadine (CLARITIN) 10 MG tablet Take 10 mg by mouth daily.    Marland Kitchen omeprazole (PRILOSEC) 20 MG capsule Take 1 capsule (20 mg total) by mouth 2 (two) times daily before a meal. 60 capsule 5   No current facility-administered medications for this visit.    Review of Systems  Constitutional: Negative for chills, fatigue, fever and unexpected weight change.  HENT: Negative for trouble swallowing.  Eyes: Negative for loss of vision.  Respiratory: Negative for cough, shortness of breath and wheezing.  Cardiovascular: Negative for chest pain, leg swelling, palpitations and syncope.  GI: Negative for abdominal pain, blood in stool, diarrhea, nausea and vomiting.  GU: Negative for difficulty urinating, dysuria, frequency and hematuria.  Musculoskeletal: Negative for back pain, leg pain and joint pain.  Skin: Negative for rash.  Neurological: Negative for dizziness, headaches, light-headedness, numbness and seizures.  Psychiatric: Negative for behavioral problem, confusion, depressed mood and sleep disturbance.        Objective:  Objective   Vitals:   06/12/20 1101  BP: 136/72  Weight: 159 lb (72.1 kg)  Height: 5\' 7"  (1.702 m)   Body mass index is 24.9 kg/m.  Physical Exam Vitals and nursing note reviewed.  Constitutional:      Appearance: She is well-developed and well-nourished.  HENT:     Head: Normocephalic and atraumatic.  Eyes:     Extraocular Movements: EOM normal.     Pupils: Pupils are equal, round, and reactive to light.  Cardiovascular:     Rate and Rhythm: Normal rate and regular rhythm.  Pulmonary:     Effort: Pulmonary effort is normal. No respiratory distress.  Genitourinary:    General: Normal vulva.  Skin:    General: Skin is warm and dry.  Neurological:     Mental Status: She is alert and oriented to person, place, and time.   Psychiatric:        Mood and Affect: Mood and affect normal.        Behavior: Behavior normal.        Thought Content: Thought content normal.        Judgment: Judgment normal.   Pessary Fitting Patient presents for a pessary fitting. She desires a pessary as her means of controlling her symptoms of prolapse and/or urinary incontinence. She understands the care needed for a pessary and desires to proceed. Alternative treatment options have been discussed at length and the patient voices an understanding of each option.   PROCEDURE: The patient was placed in dorsal lithotomy position. Examination confirmed prolapse. A 3 ring pessary was fitted without difficulty. The patient subsequently ambulated, voided and performed valsalva maneuvers without dislodging the pessary and without discomfort. Care instructions were provided. Patient was discharged to home in stable condition.     Assessment/Plan:     70 yo with rectocele 3 ring pessary placed. Patient ambulated and sat on the toilet. She was happy with the pessary, discharged home.  She will return in 3 months for follow up Premarin 1-2 times a week to maintain vaginal tissues.    Adrian Prows MD [facility], [facility] 06/12/2020 11:07 AM

## 2020-08-10 ENCOUNTER — Other Ambulatory Visit: Payer: Self-pay

## 2020-08-10 ENCOUNTER — Encounter: Payer: Self-pay | Admitting: Obstetrics and Gynecology

## 2020-08-10 ENCOUNTER — Ambulatory Visit (INDEPENDENT_AMBULATORY_CARE_PROVIDER_SITE_OTHER): Payer: Medicare Other | Admitting: Obstetrics and Gynecology

## 2020-08-10 VITALS — BP 120/70 | Ht 67.0 in | Wt 157.0 lb

## 2020-08-10 DIAGNOSIS — R875 Abnormal microbiological findings in specimens from female genital organs: Secondary | ICD-10-CM | POA: Diagnosis not present

## 2020-08-10 DIAGNOSIS — N816 Rectocele: Secondary | ICD-10-CM | POA: Diagnosis not present

## 2020-08-10 DIAGNOSIS — Z960 Presence of urogenital implants: Secondary | ICD-10-CM

## 2020-08-10 NOTE — Progress Notes (Signed)
HPI:      Ms. Kim Mann is a 70 y.o. No obstetric history on file. who presents today for her pessary follow up and examination related to her pelvic floor weakening.  Pt reports tolerating the pessary well. It helped with bowel movements and improved her hemorrhoids. However. She reports 3 weeks ago she removed it and was not able to replace it on her own at home. She would like to learn to replace the device. She would also like to speak with a urogynecologist regarding prolapse surgery options.  She is voiding and defecating without difficulty. She currently has a #3 ring pessary.  She is also concerned she may have a yeast infection.  PMHx: She  has a past medical history of GERD (gastroesophageal reflux disease) and H. pylori infection. Also,  has a past surgical history that includes Cataract extraction, bilateral; Strabismus surgery (Right, 02/19/2016); Adjustable suture manipulation (Right, 02/19/2016); Colonoscopy (N/A, 04/04/2017); Colonoscopy with propofol (N/A, 05/25/2020); and polypectomy (05/25/2020)., family history includes Brain cancer (age of onset: 29) in her mother; Colon cancer (age of onset: 34) in her sister; Heart failure (age of onset: 39) in her father.,  reports that she has never smoked. She has never used smokeless tobacco. She reports that she does not drink alcohol and does not use drugs.  She has a current medication list which includes the following prescription(s): vitamin d3, loratadine, and omeprazole. Also, is allergic to latex and sulfa antibiotics.  Review of Systems  Constitutional: Negative for chills, fever, malaise/fatigue and weight loss.  HENT: Negative for congestion, hearing loss and sinus pain.   Eyes: Negative for blurred vision and double vision.  Respiratory: Negative for cough, sputum production, shortness of breath and wheezing.   Cardiovascular: Negative for chest pain, palpitations, orthopnea and leg swelling.  Gastrointestinal: Negative for  abdominal pain, constipation, diarrhea, nausea and vomiting.  Genitourinary: Negative for dysuria, flank pain, frequency, hematuria and urgency.  Musculoskeletal: Negative for back pain, falls and joint pain.  Skin: Negative for itching and rash.  Neurological: Negative for dizziness and headaches.  Psychiatric/Behavioral: Negative for depression, substance abuse and suicidal ideas. The patient is not nervous/anxious.     Objective: BP 120/70   Ht 5\' 7"  (1.702 m)   Wt 157 lb (71.2 kg)   BMI 24.59 kg/m  Physical Exam Constitutional:      Appearance: She is well-developed.  Genitourinary:     Genitourinary Comments: External: Normal appearing vulva. No lesions noted.  Speculum examination: Normal appearing cuff. No blood in the vaginal vault. No discharge.  No erosions  HENT:     Head: Normocephalic and atraumatic.  Neck:     Thyroid: No thyromegaly.  Cardiovascular:     Rate and Rhythm: Normal rate and regular rhythm.     Heart sounds: Normal heart sounds.  Pulmonary:     Effort: Pulmonary effort is normal.     Breath sounds: Normal breath sounds.  Abdominal:     General: Bowel sounds are normal. There is no distension.     Palpations: Abdomen is soft. There is no mass.  Musculoskeletal:     Cervical back: Neck supple.  Neurological:     Mental Status: She is alert and oriented to person, place, and time.  Skin:    General: Skin is warm and dry.  Psychiatric:        Behavior: Behavior normal.        Thought Content: Thought content normal.  Judgment: Judgment normal.  Vitals reviewed.     Pessary Care Pessary removed and cleaned.  Vagina checked - without erosions - pessary replaced.  A/P:  Pessary was cleaned and replaced today. Instructions given for care. Concerning symptoms to observe for are counseled to patient. Follow up scheduled for 3 months. Nuswab collected and sent, patient concern for yeast infection.   A total of 20 minutes were spent  face-to-face with the patient as well as preparation, review, communication, and documentation during this encounter.   Adrian Prows MD, [facility], [facility] 08/10/2020 11:13 AM

## 2020-08-13 LAB — NUSWAB BV AND CANDIDA, NAA
Candida albicans, NAA: NEGATIVE
Candida glabrata, NAA: NEGATIVE

## 2020-08-14 ENCOUNTER — Telehealth: Payer: Self-pay

## 2020-08-14 NOTE — Telephone Encounter (Signed)
Pt aware of normal results.

## 2020-08-14 NOTE — Telephone Encounter (Signed)
Pt left msg on triage asking if her test results for yeast were back. Called pt back, no answer, [facility].

## 2020-08-25 ENCOUNTER — Encounter: Payer: Self-pay | Admitting: Gastroenterology

## 2020-08-25 ENCOUNTER — Other Ambulatory Visit: Payer: Self-pay

## 2020-08-25 ENCOUNTER — Ambulatory Visit (INDEPENDENT_AMBULATORY_CARE_PROVIDER_SITE_OTHER): Payer: Medicare Other | Admitting: Gastroenterology

## 2020-08-25 DIAGNOSIS — K648 Other hemorrhoids: Secondary | ICD-10-CM

## 2020-08-25 NOTE — Patient Instructions (Signed)
You can take the dulcolax every other day, as this works for you.  Avoid straining, constipation, and limit toilet time to 2-3 minutes.  We will see you back as needed! Please call if symptomatic hemorrhoids.  Your next colonoscopy is in 2025!  It was a pleasure to see you today. I want to create trusting relationships with patients to provide genuine, compassionate, and quality care. I value your feedback. If you receive a survey regarding your visit,  I greatly appreciate you taking time to fill this out.   Annitta Needs, PhD, ANP-BC [facility]

## 2020-08-25 NOTE — Progress Notes (Signed)
Referring Provider: [facility]* Primary Care Physician:  [facility]  Primary GI: Dr. Abbey Chatters   Chief Complaint  Patient presents with  . pp fu    ? Discuss hem banding    HPI:   Kim Mann is a 70 y.o. female presenting today with a history of hemorrhoids, s/p colonoscopy recently with need for 3 year surveillance in 2025. Here to discuss hemorrhoids.   She notes hemorrhoid surgery in 1999. Dealing with rectocele currently. Tried pessary but this was not tolerated well. Fiber seemed to bulk stool up too much. Not taking anything right now. She notes a BM about daily. Noted the best results with a small dulcolax tablet every other day and wonders if this is ok to take routinely. States she has no constipation if taking this, stool evacuates easily. No rectal bleeding. Mild itching occasionally but not often. No prolapsing tissue. No soiling.     Past Medical History:  Diagnosis Date  . GERD (gastroesophageal reflux disease)   . H. pylori infection    BREATH TEST NEGATIVE FOLLOWING ABX.    Past Surgical History:  Procedure Laterality Date  . ADJUSTABLE SUTURE MANIPULATION Right 02/19/2016   Procedure: ADJUSTABLE SUTURE MANIPULATION;  Surgeon: Everitt Amber, MD;  Location: [place];  Service: Ophthalmology;  Laterality: Right;  . CATARACT EXTRACTION, BILATERAL    . COLONOSCOPY N/A 04/04/2017   Procedure: COLONOSCOPY;  Surgeon: Danie Binder, MD;  [facility];  Service: Endoscopy;  Laterality: N/A;  12:45pm - LM for pt to arrive at 12:15  . COLONOSCOPY WITH PROPOFOL N/A 05/25/2020   Internal hemorrhoids, many small and large-mouthed diverticula in sigmoid, descending, and transverse colon. 3 mm polyp in sigmoid. 10 mm polyp in transverse. 3 year surveillance. Sessile serrated polyps.   Marland Kitchen POLYPECTOMY  05/25/2020   Procedure: POLYPECTOMY;  Surgeon: Eloise Harman, DO;  [facility];  Service:  Endoscopy;;  . STRABISMUS SURGERY Right 02/19/2016   Procedure: REPAIR STRABISMUS;  Surgeon: Everitt Amber, MD;  Location: [place];  Service: Ophthalmology;  Laterality: Right;    Current Outpatient Medications  Medication Sig Dispense Refill  . Cholecalciferol (VITAMIN D3) 50 MCG (2000 UT) TABS Take 1,000 Units by mouth daily.    Marland Kitchen omeprazole (PRILOSEC) 20 MG capsule Take 1 capsule (20 mg total) by mouth 2 (two) times daily before a meal. 60 capsule 5  . loratadine (CLARITIN) 10 MG tablet Take 10 mg by mouth daily. (Patient not taking: Reported on 08/25/2020)     No current facility-administered medications for this visit.    Allergies as of 08/25/2020 - Review Complete 08/25/2020  Allergen Reaction Noted  . Latex Rash 04/29/2020  . Sulfa antibiotics Rash 02/15/2016    Family History  Problem Relation Age of Onset  . Brain cancer Mother 54  . Heart failure Father 78  . Colon cancer Sister 90  . Colon polyps Neg Hx     Social History   Socioeconomic History  . Marital status: Married    Spouse name: Not on file  . Number of children: Not on file  . Years of education: Not on file  . Highest education level: Not on file  Occupational History  . Not on file  Tobacco Use  . Smoking status: Never Smoker  . Smokeless tobacco: Never Used  Vaping Use  . Vaping Use: Never used  Substance and Sexual Activity  .  Alcohol use: No  . Drug use: No  . Sexual activity: Not on file  Other Topics Concern  . Not on file  Social History Narrative   2 KIDS: AGE 32 AND 35. 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   HAS SIX SIBLINGS(3 SIS, 2 BRO)   Social Determinants of Health   Financial Resource Strain: Not on file  Food Insecurity: Not on file  Transportation Needs: Not on file  Physical Activity: Not on file  Stress: Not on file  Social Connections: Not on file    Review of Systems: Gen: Denies fever,  chills, anorexia. Denies fatigue, weakness, weight loss.  CV: Denies chest pain, palpitations, syncope, peripheral edema, and claudication. Resp: Denies dyspnea at rest, cough, wheezing, coughing up blood, and pleurisy. GI: see HPI Derm: Denies rash, itching, dry skin Psych: Denies depression, anxiety, memory loss, confusion. No homicidal or suicidal ideation.  Heme: Denies bruising, bleeding, and enlarged lymph nodes.  Physical Exam: BP 136/79   Pulse 77   Temp (!) 97.1 F (36.2 C)   Ht 5\' 7"  (1.702 m)   Wt 161 lb 12.8 oz (73.4 kg)   BMI 25.34 kg/m  General:   Alert and oriented. No distress noted. Pleasant and cooperative. Much younger appearing than stated age Head:  Normocephalic and atraumatic. Eyes:  Conjuctiva clear without scleral icterus. Mouth:  Mask in place Rectal: redundant external tissue, skin tags, internal exam no mass, query left lateral prominence internal hemorrhoid Msk:  Symmetrical without gross deformities. Normal posture. Extremities:  Without edema. Neurologic:  Alert and  oriented x4 Psych:  Alert and cooperative. Normal mood and affect.  ASSESSMENT/PLAN: Kim Mann is a 70 y.o. female presenting today with history of hemorrhoids, rectocele, constipation, s/p colonoscopy recently, here to discuss potential banding.  At this time, she has no significant symptoms and the most pressing issue is finding a bowel regimen that works best for her. She does note doing extremely well with a dulcolax tablet every other day, and finds this works best with her history of rectocele. Fiber seems to "bulk" too much and cause straining.  Rectal exam completed. As she is not having symptoms, will hold off on banding. She is to return if needed for banding. May take dulcolax every other day. She is in excellent health and getting fiber in by diet well.   Colonoscopy in 2025. On recall list.   Return prn.   Annitta Needs, PhD, ANP-BC [facility]

## 2020-09-22 ENCOUNTER — Ambulatory Visit: Payer: Medicare Other | Admitting: Obstetrics and Gynecology

## 2020-11-11 ENCOUNTER — Ambulatory Visit: Payer: Medicare Other | Admitting: Obstetrics and Gynecology

## 2020-11-26 ENCOUNTER — Telehealth: Payer: Self-pay

## 2020-11-26 NOTE — Telephone Encounter (Signed)
Attempt made to contact Kim Mann is a 70 y.o. female re: New Patient appointment with Dr. Wannetta Sender. Pt was not available.  LM on the VM for the patient to call me back.

## 2020-12-01 ENCOUNTER — Other Ambulatory Visit: Payer: Self-pay

## 2020-12-01 ENCOUNTER — Ambulatory Visit (INDEPENDENT_AMBULATORY_CARE_PROVIDER_SITE_OTHER): Payer: Medicare Other | Admitting: Obstetrics and Gynecology

## 2020-12-01 ENCOUNTER — Encounter: Payer: Self-pay | Admitting: Obstetrics and Gynecology

## 2020-12-01 VITALS — BP 177/74 | HR 74 | Wt 160.0 lb

## 2020-12-01 DIAGNOSIS — R350 Frequency of micturition: Secondary | ICD-10-CM | POA: Diagnosis not present

## 2020-12-01 DIAGNOSIS — K5900 Constipation, unspecified: Secondary | ICD-10-CM

## 2020-12-01 DIAGNOSIS — N816 Rectocele: Secondary | ICD-10-CM | POA: Diagnosis not present

## 2020-12-01 LAB — POCT URINALYSIS DIPSTICK
Appearance: NORMAL
Bilirubin, UA: NEGATIVE
Blood, UA: NEGATIVE
Glucose, UA: NEGATIVE
Ketones, UA: NEGATIVE
Nitrite, UA: NEGATIVE
Protein, UA: NEGATIVE
Spec Grav, UA: <=1.005 — AB (ref 1.010–1.025)
Urobilinogen, UA: 0.2 E.U./dL
pH, UA: 5 (ref 5.0–8.0)

## 2020-12-01 NOTE — Progress Notes (Signed)
[facility] New Patient Evaluation and Consultation  Referring Provider: Homero Fellers, * PCP: [facility] Date of Service: 12/01/2020  SUBJECTIVE Chief Complaint: New Patient (Initial Visit)  History of Present Illness: Kim Mann is a 70 y.o. White or Caucasian female seen in consultation at the request of Dr. Gilman Schmidt for evaluation of proapse.    Review of records significant for: Has been using a #3 ring pessary for her prolapse. Sometimes not able to replace on her own. She is interested in surgical correction.   Urinary Symptoms: Does not leak urine.   Day time voids every few hours.  Nocturia: sometime 1 time per night to void. Voiding dysfunction: she empties her bladder well.  does not use a catheter to empty bladder.  When urinating, she feels dribbling after finishing  UTIs:  0  UTI's in the last year.   Denies history of blood in urine and kidney or bladder stones  Pelvic Organ Prolapse Symptoms:                  She Admits to a feeling of a bulge the vaginal area. It has been present for 10 years.  She Denies seeing a bulge.  This bulge is bothersome. Has tried several pessaries- some fell out. She also needed to remove it recently due to heavy discharge- was wetting clothes.  Has a history of cystocele repair x2- one with alloderm graft. Was already s/p hysterectomy at the time of that surgery.  Bowel Symptom: Bowel movements: every 2 days  Stool consistency: hard Straining: yes.  Splinting: no.  Incomplete evacuation: no.  She Denies accidental bowel leakage / fecal incontinence Bowel regimen: dulcolax every other day, water. Tried fiber supplement but stool became too soft and got stuck.  Last colonoscopy: Date 05/25/20, Results: normal  Sexual Function Sexually active: no.   Pelvic Pain Denies pelvic pain   Past Medical History:  Past Medical History:  Diagnosis Date   GERD (gastroesophageal  reflux disease)    H. pylori infection    BREATH TEST NEGATIVE FOLLOWING ABX.     Past Surgical History:   Past Surgical History:  Procedure Laterality Date   ADJUSTABLE SUTURE MANIPULATION Right 02/19/2016   Procedure: ADJUSTABLE SUTURE MANIPULATION;  Surgeon: Everitt Amber, MD;  Location: [facility];  Service: Ophthalmology;  Laterality: Right;   CATARACT EXTRACTION, BILATERAL     COLONOSCOPY N/A 04/04/2017   Procedure: COLONOSCOPY;  Surgeon: Danie Binder, MD;  [facility];  Service: Endoscopy;  Laterality: N/A;  12:45pm - LM for pt to arrive at 12:15   COLONOSCOPY WITH PROPOFOL N/A 05/25/2020   Internal hemorrhoids, many small and large-mouthed diverticula in sigmoid, descending, and transverse colon. 3 mm polyp in sigmoid. 10 mm polyp in transverse. 3 year surveillance. Sessile serrated polyps.    CYSTOCELE REPAIR     x2 at [facility]- once with alloderm graft   HYSTERECTOMY ABDOMINAL WITH SALPINGO-OOPHORECTOMY     POLYPECTOMY  05/25/2020   Procedure: POLYPECTOMY;  Surgeon: Eloise Harman, DO;  [facility];  Service: Endoscopy;;   STRABISMUS SURGERY Right 02/19/2016   Procedure: REPAIR STRABISMUS;  Surgeon: Everitt Amber, MD;  Location: [place];  Service: Ophthalmology;  Laterality: Right;     Past OB/GYN History: G2 P2 Vaginal deliveries: 2,  Forceps/ Vacuum deliveries: 0, Cesarean section: 0 Menopausal: Yes, Denies vaginal bleeding since menopause Last pap smear was 2021- negative.  Any history  of abnormal pap smears: no.   Medications: She has a current medication list which includes the following prescription(s): vitamin d3 and omeprazole.   Allergies: Patient is allergic to latex and sulfa antibiotics.   Social History:  Social History   Tobacco Use   Smoking status: Never   Smokeless tobacco: Never  Vaping Use   Vaping Use: Never used  Substance Use Topics   Alcohol use: No   Drug use: No     Relationship status: married She lives with husband.   She is not employed- retired. Regular exercise: Yes: walks 3 miles 4 days a week   Family History:   Family History  Problem Relation Age of Onset   Brain cancer Mother 28   Heart failure Father 71   Colon cancer Sister 85   Colon polyps Neg Hx      Review of Systems: Review of Systems  Constitutional:  Negative for fever, malaise/fatigue and weight loss.  Respiratory:  Negative for cough, shortness of breath and wheezing.   Cardiovascular:  Negative for chest pain, palpitations and leg swelling.  Gastrointestinal:  Negative for abdominal pain and blood in stool.  Genitourinary:  Negative for dysuria.  Musculoskeletal:  Negative for myalgias.  Skin:  Negative for rash.  Neurological:  Negative for dizziness and headaches.  Endo/Heme/Allergies:  Does not bruise/bleed easily.  Psychiatric/Behavioral:  Negative for depression. The patient is not nervous/anxious.     OBJECTIVE Physical Exam: Vitals:   12/01/20 1049  BP: (!) 170/71  Pulse: 73  Weight: 160 lb (72.6 kg)    Physical Exam Constitutional:      General: She is not in acute distress. Pulmonary:     Effort: Pulmonary effort is normal.  Abdominal:     General: There is no distension.     Palpations: Abdomen is soft.     Tenderness: There is no abdominal tenderness. There is no rebound.  Musculoskeletal:        General: No swelling. Normal range of motion.  Skin:    General: Skin is warm and dry.     Findings: No rash.  Neurological:     Mental Status: She is alert and oriented to person, place, and time.  Psychiatric:        Mood and Affect: Mood normal.        Behavior: Behavior normal.     GU / Detailed Urogynecologic Evaluation:  Pelvic Exam: Normal external female genitalia; Bartholin's and Skene's glands normal in appearance; urethral meatus normal in appearance, no urethral masses or discharge.   CST: negative  s/p hysterectomy:  Speculum exam reveals normal vaginal mucosa with  atrophy and normal vaginal cuff.    Pelvic floor strength I/V  Pelvic floor musculature: Right levator non-tender, Right obturator non-tender, Left levator non-tender, Left obturator non-tender  POP-Q:   POP-Q  -2                                            Aa   -2                                           Ba  -6  C   4                                            Gh  4.5                                            Pb  7.5                                            tvl   -0.5                                            Ap  -0.5                                            Bp                                                 D     Rectal Exam:  Normal sphincter tone, moderate distal rectocele, enterocoele not present, no rectal masses, no sign of dyssynergia when asking the patient to bear down.  Post-Void Residual (PVR) by Bladder Scan: In order to evaluate bladder emptying, we discussed obtaining a postvoid residual and she agreed to this procedure.  Procedure: The ultrasound unit was placed on the patient's abdomen in the suprapubic region after the patient had voided. A PVR of 86 ml was obtained by bladder scan.  Laboratory Results: POC urine: moderate leuk, negative nitrites  I visualized the urine specimen, noting the specimen to be clear yellow  ASSESSMENT AND PLAN Ms. Blakley is a 70 y.o. with:  1. Prolapse of posterior vaginal wall   2. Urinary frequency   3. Constipation, unspecified constipation type    Stage I anterior, Stage II posterior, Stage I apical prolapse - For treatment of pelvic organ prolapse, we discussed options for management including expectant management, conservative management, and surgical management, such as Kegels, a pessary, pelvic floor physical therapy, and specific surgical procedures. -She is interested in proceeding with surgical correction-  recommended posterior repair with perineorrhaphy and possible sacrosopinous ligament fixation.  - Recommended simple CMG prior to scheduling to assess for occult incontinence prior to scheduling surgery.   2. Constipation - For constipation, we reviewed the importance of a better bowel regimen.  We also discussed the importance of avoiding chronic straining, as it can exacerbate her pelvic floor symptoms; we discussed treating constipation and straining prior to surgery, as postoperative straining can lead to damage to the repair and recurrence of symptoms. We discussed initiating therapy with increasing fluid intake, fiber supplementation, stool softeners, and laxatives such as miralax. She will stop senokot and take miralax daily to start.    Return for CMG procedure   Jaquita Folds, MD  Medical Decision Making:  - Reviewed/ ordered a clinical laboratory test - Reviewed/ ordered medicine test - Review and summation of prior records

## 2020-12-01 NOTE — Patient Instructions (Signed)
Start miralax and stop senokot.

## 2020-12-14 NOTE — Progress Notes (Signed)
[facility] Return Visit  SUBJECTIVE  History of Present Illness: Kim Mann is a 70 y.o. female seen in follow-up for prolapse and simple CMG.   She started Miralax daily and has been having daily regular bowel movements without straining.    Past Medical History: Patient  has a past medical history of GERD (gastroesophageal reflux disease) and H. pylori infection.   Past Surgical History: She  has a past surgical history that includes Cataract extraction, bilateral; Strabismus surgery (Right, 02/19/2016); Adjustable suture manipulation (Right, 02/19/2016); Colonoscopy (N/A, 04/04/2017); Colonoscopy with propofol (N/A, 05/25/2020); polypectomy (05/25/2020); Hysterectomy abdominal with salpingo-oophorectomy; and Cystocele repair.   Medications: She has a current medication list which includes the following prescription(s): vitamin d3 and omeprazole.   Allergies: Patient is allergic to latex and sulfa antibiotics.   Social History: Patient  reports that she has never smoked. She has never used smokeless tobacco. She reports that she does not drink alcohol and does not use drugs.      OBJECTIVE     Physical Exam: Vitals:   12/15/20 1131  BP: (!) 164/75  Pulse: 75  Weight: 160 lb (72.6 kg)   Gen: No apparent distress, A&O x 3.  Detailed Urogynecologic Evaluation:  Deferred. Prior exam showed:  POP-Q:   POP-Q   -2                                            Aa   -2                                           Ba   -6                                              C    4                                            Gh   4.5                                            Pb   7.5                                            tvl    -0.5                                            Ap   -0.5  Bp                                                  D         Verbal consent was obtained to perform simple CMG  procedure:   Prolapse was reduced using 2 large cotton swabs. Urethra was prepped with betadine and a 33F catheter was placed and bladder was drained completely. The bladder was then backfilled with sterile water by gravity.  First sensation: 180 First Desire: 220 Strong Desire: 240 Capacity: 300 Small DO was noted with filling Cough stress test was negative. Valsalva stress test was negative.  This was performed in the sitting and standing positions. The catheter was then replaced to drain her bladder.   Interpretation: CMG showed normal sensation, and normal cystometric capacity. Findings negative for stress incontinence, positive for detrusor overactivity.    ASSESSMENT AND PLAN    [name] is a 70 y.o. with:  1. Prolapse of posterior vaginal wall   2. Constipation, unspecified constipation type    POP - She would like to proceed with surgery- posterior repair with perineorrhaphy and possible sacrospinous ligament fixation.  - Will book surgery date and have her return to pre op   2. Constipation - significantly improved with miralax. Discussed importance of continuing this to prevent straining.   Return for pre-op visit  Jaquita Folds, MD   Time spent: I spent 20 minutes dedicated to the care of this patient on the date of this encounter to include pre-visit review of records, face-to-face time with the patient and post visit documentation. Additional time was spend for the procedure.

## 2020-12-15 ENCOUNTER — Ambulatory Visit (INDEPENDENT_AMBULATORY_CARE_PROVIDER_SITE_OTHER): Payer: Medicare Other | Admitting: Obstetrics and Gynecology

## 2020-12-15 ENCOUNTER — Other Ambulatory Visit: Payer: Self-pay

## 2020-12-15 ENCOUNTER — Encounter: Payer: Self-pay | Admitting: Obstetrics and Gynecology

## 2020-12-15 VITALS — BP 164/75 | HR 75 | Wt 160.0 lb

## 2020-12-15 DIAGNOSIS — K5900 Constipation, unspecified: Secondary | ICD-10-CM | POA: Diagnosis not present

## 2020-12-15 DIAGNOSIS — N816 Rectocele: Secondary | ICD-10-CM

## 2020-12-15 NOTE — Patient Instructions (Signed)
We will call you with possible surgery dates.

## 2020-12-17 ENCOUNTER — Telehealth: Payer: Self-pay | Admitting: Obstetrics and Gynecology

## 2020-12-17 NOTE — Telephone Encounter (Signed)
error 

## 2021-01-06 ENCOUNTER — Other Ambulatory Visit: Payer: Self-pay

## 2021-01-06 ENCOUNTER — Encounter (HOSPITAL_BASED_OUTPATIENT_CLINIC_OR_DEPARTMENT_OTHER): Payer: Self-pay | Admitting: Obstetrics and Gynecology

## 2021-01-06 NOTE — Progress Notes (Signed)
Spoke w/ via phone for pre-op interview--- Pt Lab needs dos---- [facility] (per anes)/  pre-op orders pending              Lab results------ no COVID test -----patient states asymptomatic no test needed Arrive at ------- 1000 on 01-11-2021 NPO after MN NO Solid Food.  Clear liquids from MN until--- 0900 Med rec completed Medications to take morning of surgery ----- Prilosec Diabetic medication ----- n/a Patient instructed no nail polish to be worn day of surgery Patient instructed to bring photo id and insurance card day of surgery Patient aware to have Driver (ride ) / caregiver for 24 hours after surgery --husband, Simona Huh Patient Special Instructions ----- n/a Pre-Op special Istructions ----- sent inbox message to dr schroeder in epic requested orders Patient verbalized understanding of instructions that were given at this phone interview. Patient denies shortness of breath, chest pain, fever, cough at this phone interview.

## 2021-01-08 ENCOUNTER — Other Ambulatory Visit: Payer: Self-pay | Admitting: Obstetrics and Gynecology

## 2021-01-08 DIAGNOSIS — Z01818 Encounter for other preprocedural examination: Secondary | ICD-10-CM

## 2021-01-08 MED ORDER — ACETAMINOPHEN 500 MG PO TABS: 500.0000 mg | ORAL_TABLET | Freq: Four times a day (QID) | ORAL | 0 refills | Status: DC | PRN

## 2021-01-08 MED ORDER — OXYCODONE HCL 5 MG PO TABS: 5.0000 mg | ORAL_TABLET | ORAL | 0 refills | Status: DC | PRN

## 2021-01-08 MED ORDER — IBUPROFEN 600 MG PO TABS: 600.0000 mg | ORAL_TABLET | Freq: Four times a day (QID) | ORAL | 0 refills | Status: DC | PRN

## 2021-01-08 NOTE — H&P (Signed)
Urogynecology Pre-Operative H&P  Subjective Chief Complaint: ARMINDA Mann presents for a preoperative encounter.   History of Present Illness: Kim Mann is a 70 y.o. female who presents for preoperative visit.  She is scheduled to undergo Exam under anesthesia, posterior repair with perineorrhaphy and possible sacrospinous ligament fixation on 01/11/21.  Her symptoms include vaginal bulge and constipation, and she was was found to have Stage I anterior, Stage II posterior, Stage I apical prolapse.  Simple CMG Interpretation: CMG showed normal sensation, and normal cystometric capacity. Findings negative for stress incontinence, positive for detrusor overactivity.   Past Medical History:  Diagnosis Date   Chronic constipation    GERD (gastroesophageal reflux disease)    History of Helicobacter pylori infection 10/2013   per EGD w/ bx,  treated   Pre-diabetes    Prolapse of posterior vaginal wall      Past Surgical History:  Procedure Laterality Date   ADJUSTABLE SUTURE MANIPULATION Right 02/19/2016   Procedure: ADJUSTABLE SUTURE MANIPULATION;  Surgeon: Everitt Amber, MD;  Location: [facility];  Service: Ophthalmology;  Laterality: Right;   CATARACT EXTRACTION W/ INTRAOCULAR LENS IMPLANT Bilateral 2017   COLONOSCOPY N/A 04/04/2017   Procedure: COLONOSCOPY;  Surgeon: Danie Binder, MD;  [facility];  Service: Endoscopy;  Laterality: N/A;  12:45pm - LM for pt to arrive at 12:15   COLONOSCOPY WITH PROPOFOL N/A 05/25/2020   Internal hemorrhoids, many small and large-mouthed diverticula in sigmoid, descending, and transverse colon. 3 mm polyp in sigmoid. 10 mm polyp in transverse. 3 year surveillance. Sessile serrated polyps.    CYSTOCELE REPAIR  03/25/2003   '@[facility]'$ ;  Anterior Colporrhaphy   ESOPHAGOGASTRODUODENOSCOPY  10/2013   POLYPECTOMY  05/25/2020   Procedure: POLYPECTOMY;  Surgeon: Eloise Harman, DO;  [facility];   Service: Endoscopy;;   STRABISMUS SURGERY Right 02/19/2016   Procedure: REPAIR STRABISMUS;  Surgeon: Everitt Amber, MD;  Location: [place];  Service: Ophthalmology;  Laterality: Right;   STRABISMUS SURGERY Right    age 46 and 54   TOTAL ABDOMINAL HYSTERECTOMY W/ BILATERAL SALPINGOOPHORECTOMY  02/15/1999   '@[facility]'$  (operative record in care everywhere)  ;  Culdoplasty /  Burch Cystourethropexy/  Posterior Colporrhaphy    is allergic to latex and sulfa antibiotics.   Family History  Problem Relation Age of Onset   Brain cancer Mother 63   Heart failure Father 60   Colon cancer Sister 82   Colon polyps Neg Hx     Social History   Tobacco Use   Smoking status: Former    Years: 15.00    Types: Cigarettes    Quit date: 1980    Years since quitting: 42.6   Smokeless tobacco: Never  Vaping Use   Vaping Use: Never used  Substance Use Topics   Alcohol use: No   Drug use: No     Review of Systems was negative for a full 10 system review except as noted in the History of Present Illness.  No current facility-administered medications for this encounter.  Current Outpatient Medications:    Cholecalciferol (VITAMIN D3) 50 MCG (2000 UT) TABS, Take 2,000 Units by mouth daily., Disp: , Rfl:    omeprazole (PRILOSEC OTC) 20 MG tablet, Take 20 mg by mouth daily., Disp: , Rfl:    Polyethylene Glycol 3350 (MIRALAX PO), Take by mouth daily., Disp: , Rfl:    Objective   Previous Pelvic Exam showed: POP-Q:   POP-Q   -  2                                            Aa   -2                                           Ba   -6                                              C    4                                            Gh   4.5                                            Pb   7.5                                            tvl    -0.5                                            Ap   -0.5                                            Bp                                                   D       Assessment/ Plan  Assessment: The patient is a 70 y.o. year old scheduled to undergo posterior repair with perineorrhaphy and possible sacrospinous ligament fixation. . Verbal consent was obtained for these procedures.  Plan: General Surgical Consent: The patient has previously been counseled on alternative treatments, and the decision by the patient and provider was to proceed with the procedure listed above.  For all procedures, there are risks of bleeding, infection, damage to surrounding organs including but not limited to bowel, bladder, blood vessels, ureters and nerves, and need for further surgery if an injury were to occur. These risks are all low with minimally invasive surgery.   There are risks of numbness and weakness at any body site or buttock/rectal pain.  It is possible that baseline pain can be worsened by surgery, either with or without mesh. If surgery is vaginal, there is also a low risk of possible conversion to laparoscopy or open abdominal incision where indicated. Very rare risks include  blood transfusion, blood clot, heart attack, pneumonia, or death.   There is also a risk of short-term postoperative urinary retention with need to use a catheter. About half of patients need to go home from surgery with a catheter, which is then later removed in the office. The risk of long-term need for a catheter is very low. There is also a risk of worsening of overactive bladder.    Prolapse (with or without mesh): Risk factors for surgical failure  include things that put pressure on your pelvis and the surgical repair, including obesity, chronic cough, and heavy lifting or straining (including lifting children or adults, straining on the toilet, or lifting heavy objects such as furniture or anything weighing >25 lbs. Risks of recurrence is 20-30% with vaginal native tissue repair and a less than 10% with sacrocolpopexy with mesh.      We discussed  consent for blood products. Risks for blood transfusion include allergic reactions, other reactions that can affect different body organs and managed accordingly, transmission of infectious diseases such as HIV or Hepatitis. However, the blood is screened. Patient consents for blood products.  Pre-operative instructions:  She was instructed to not take Aspirin/NSAIDs x 7days prior to surgery.   Cathter use: Patient will go home with foley if needed after post-operative voiding trial.  Post-operative instructions:  She was provided with specific post-operative instructions, including precautions and signs/symptoms for which we would recommend contacting us, in addition to daytime and after-hours contact phone numbers. This was provided on a handout.   Post-operative medications: Prescriptions for motrin, tylenol, miralax, and oxycodone were sent to her pharmacy. Discussed using ibuprofen and tylenol on a schedule to limit use of narcotics.   Laboratory testing:  We will check labs: CBC.   Preoperative clearance:  She does not require surgical clearance.    Post-operative follow-up:  A post-operative appointment will be made for 6 weeks from the date of surgery. If she needs a post-operative nurse visit for a voiding trial, that will be set up after she leaves the hospital.    Patient will call the clinic or use MyChart should anything change or any new issues arise.   Jaquita Folds, MD

## 2021-01-11 ENCOUNTER — Encounter (HOSPITAL_BASED_OUTPATIENT_CLINIC_OR_DEPARTMENT_OTHER)
Admission: RE | Disposition: A | Discharge status: Home / Self Care | Payer: Self-pay | Source: Home / Self Care | Attending: Obstetrics and Gynecology

## 2021-01-11 ENCOUNTER — Encounter (HOSPITAL_BASED_OUTPATIENT_CLINIC_OR_DEPARTMENT_OTHER): Payer: Self-pay | Admitting: Obstetrics and Gynecology

## 2021-01-11 ENCOUNTER — Ambulatory Visit (HOSPITAL_BASED_OUTPATIENT_CLINIC_OR_DEPARTMENT_OTHER): Payer: Medicare Other | Admitting: Anesthesiology

## 2021-01-11 ENCOUNTER — Ambulatory Visit (HOSPITAL_BASED_OUTPATIENT_CLINIC_OR_DEPARTMENT_OTHER)
Admission: RE | Admit: 2021-01-11 | Discharge: 2021-01-11 | Disposition: A | Discharge status: Home / Self Care | Payer: Medicare Other | Attending: Obstetrics and Gynecology | Admitting: Obstetrics and Gynecology

## 2021-01-11 ENCOUNTER — Other Ambulatory Visit: Payer: Self-pay

## 2021-01-11 DIAGNOSIS — Z8719 Personal history of other diseases of the digestive system: Secondary | ICD-10-CM | POA: Insufficient documentation

## 2021-01-11 DIAGNOSIS — N816 Rectocele: Secondary | ICD-10-CM | POA: Diagnosis present

## 2021-01-11 DIAGNOSIS — Z90722 Acquired absence of ovaries, bilateral: Secondary | ICD-10-CM | POA: Diagnosis not present

## 2021-01-11 DIAGNOSIS — R7303 Prediabetes: Secondary | ICD-10-CM | POA: Insufficient documentation

## 2021-01-11 DIAGNOSIS — Z90710 Acquired absence of both cervix and uterus: Secondary | ICD-10-CM | POA: Insufficient documentation

## 2021-01-11 DIAGNOSIS — Z79899 Other long term (current) drug therapy: Secondary | ICD-10-CM | POA: Diagnosis not present

## 2021-01-11 DIAGNOSIS — K219 Gastro-esophageal reflux disease without esophagitis: Secondary | ICD-10-CM | POA: Insufficient documentation

## 2021-01-11 DIAGNOSIS — Z87891 Personal history of nicotine dependence: Secondary | ICD-10-CM | POA: Insufficient documentation

## 2021-01-11 DIAGNOSIS — Z8619 Personal history of other infectious and parasitic diseases: Secondary | ICD-10-CM | POA: Diagnosis not present

## 2021-01-11 HISTORY — DX: Prediabetes: R73.03

## 2021-01-11 HISTORY — DX: Other constipation: K59.09

## 2021-01-11 HISTORY — PX: RECTOCELE REPAIR: SHX761

## 2021-01-11 HISTORY — DX: Rectocele: N81.6

## 2021-01-11 HISTORY — PX: ANTERIOR AND POSTERIOR REPAIR WITH SACROSPINOUS FIXATION: SHX6536

## 2021-01-11 LAB — BASIC METABOLIC PANEL
Anion gap: 9 (ref 5–15)
BUN: 13 mg/dL (ref 8–23)
CO2: 27 mmol/L (ref 22–32)
Calcium: 9.4 mg/dL (ref 8.9–10.3)
Chloride: 99 mmol/L (ref 98–111)
Creatinine, Ser: 0.55 mg/dL (ref 0.44–1.00)
GFR, Estimated: >60 mL/min (ref >60)
Glucose, Bld: 115 mg/dL — ABNORMAL HIGH (ref 70–99)
Potassium: 4.1 mmol/L (ref 3.5–5.1)
Sodium: 135 mmol/L (ref 135–145)

## 2021-01-11 LAB — CBC
HCT: 44.8 % (ref 36.0–46.0)
Hemoglobin: 15.2 g/dL — ABNORMAL HIGH (ref 12.0–15.0)
MCH: 30.6 pg (ref 26.0–34.0)
MCHC: 33.9 g/dL (ref 30.0–36.0)
MCV: 90.3 fL (ref 80.0–100.0)
Platelets: 312 10*3/uL (ref 150–400)
RBC: 4.96 MIL/uL (ref 3.87–5.11)
RDW: 12.4 % (ref 11.5–15.5)
WBC: 6.4 10*3/uL (ref 4.0–10.5)
nRBC: 0 % (ref 0.0–0.2)

## 2021-01-11 LAB — POCT I-STAT, CHEM 8
BUN: 16 mg/dL (ref 8–23)
BUN: 16 mg/dL (ref 8–23)
Calcium, Ion: 1.08 mmol/L — ABNORMAL LOW (ref 1.15–1.40)
Calcium, Ion: 1.16 mmol/L (ref 1.15–1.40)
Chloride: 103 mmol/L (ref 98–111)
Chloride: 102 mmol/L (ref 98–111)
Creatinine, Ser: 0.6 mg/dL (ref 0.44–1.00)
Creatinine, Ser: 0.6 mg/dL (ref 0.44–1.00)
Glucose, Bld: 106 mg/dL — ABNORMAL HIGH (ref 70–99)
Glucose, Bld: 106 mg/dL — ABNORMAL HIGH (ref 70–99)
HCT: 44 % (ref 36.0–46.0)
HCT: 44 % (ref 36.0–46.0)
Hemoglobin: 15 g/dL (ref 12.0–15.0)
Hemoglobin: 15 g/dL (ref 12.0–15.0)
Potassium: 6.7 mmol/L (ref 3.5–5.1)
Potassium: 6.8 mmol/L (ref 3.5–5.1)
Sodium: 137 mmol/L (ref 135–145)
Sodium: 138 mmol/L (ref 135–145)
TCO2: 27 mmol/L (ref 22–32)
TCO2: 30 mmol/L (ref 22–32)

## 2021-01-11 SURGERY — COLPORRHAPHY, POSTERIOR, FOR RECTOCELE REPAIR
Anesthesia: General | Site: Vagina

## 2021-01-11 MED ORDER — KETOROLAC TROMETHAMINE 15 MG/ML IJ SOLN: 15.0000 mg | Freq: Once | INTRAMUSCULAR | Status: DC

## 2021-01-11 MED ORDER — ACETAMINOPHEN 500 MG PO TABS
1000.0000 mg | ORAL_TABLET | ORAL | Status: AC
  Administered 2021-01-11: 1000 mg via ORAL

## 2021-01-11 MED ORDER — FENTANYL CITRATE (PF) 100 MCG/2ML IJ SOLN
INTRAMUSCULAR | Status: AC
  Filled 2021-01-11: qty 2

## 2021-01-11 MED ORDER — KETOROLAC TROMETHAMINE 30 MG/ML IJ SOLN
INTRAMUSCULAR | Status: AC
  Filled 2021-01-11: qty 1

## 2021-01-11 MED ORDER — PROPOFOL 10 MG/ML IV BOLUS
INTRAVENOUS | Status: AC
  Filled 2021-01-11: qty 20

## 2021-01-11 MED ORDER — KETOROLAC TROMETHAMINE 30 MG/ML IJ SOLN
INTRAMUSCULAR | Status: DC | PRN
Start: 2021-01-11 — End: 2021-01-11
  Administered 2021-01-11: 15 mg via INTRAVENOUS

## 2021-01-11 MED ORDER — ONDANSETRON HCL 4 MG/2ML IJ SOLN: 4.0000 mg | Freq: Once | INTRAMUSCULAR | Status: DC | PRN

## 2021-01-11 MED ORDER — LIDOCAINE-EPINEPHRINE 1 %-1:100000 IJ SOLN
INTRAMUSCULAR | Status: DC | PRN
  Administered 2021-01-11: 20 mL

## 2021-01-11 MED ORDER — SODIUM CHLORIDE 0.9 % IV SOLN
2.0000 g | INTRAVENOUS | Status: AC
  Administered 2021-01-11: 2 g via INTRAVENOUS

## 2021-01-11 MED ORDER — ACETAMINOPHEN 500 MG PO TABS
ORAL_TABLET | ORAL | Status: AC
  Filled 2021-01-11: qty 2

## 2021-01-11 MED ORDER — PROPOFOL 10 MG/ML IV BOLUS
INTRAVENOUS | Status: DC | PRN
  Administered 2021-01-11: 40 mg via INTRAVENOUS
  Administered 2021-01-11: 100 mg via INTRAVENOUS
  Administered 2021-01-11: 20 mg via INTRAVENOUS

## 2021-01-11 MED ORDER — OXYCODONE HCL 5 MG/5ML PO SOLN
5.0000 mg | Freq: Once | ORAL | Status: AC | PRN
  Administered 2021-01-11: 5 mg via ORAL

## 2021-01-11 MED ORDER — LACTATED RINGERS IV SOLN: INTRAVENOUS | Status: DC

## 2021-01-11 MED ORDER — PHENYLEPHRINE HCL (PRESSORS) 10 MG/ML IV SOLN
INTRAVENOUS | Status: DC | PRN
  Administered 2021-01-11: 80 ug via INTRAVENOUS
  Administered 2021-01-11 (×3): 40 ug via INTRAVENOUS

## 2021-01-11 MED ORDER — FENTANYL CITRATE (PF) 100 MCG/2ML IJ SOLN
INTRAMUSCULAR | Status: DC | PRN
  Administered 2021-01-11: 50 ug via INTRAVENOUS

## 2021-01-11 MED ORDER — LIDOCAINE HCL (CARDIAC) PF 100 MG/5ML IV SOSY
PREFILLED_SYRINGE | INTRAVENOUS | Status: DC | PRN
Start: 2021-01-11 — End: 2021-01-11
  Administered 2021-01-11: 120 mg via INTRAVENOUS

## 2021-01-11 MED ORDER — ACETAMINOPHEN 325 MG PO TABS: 325.0000 mg | ORAL_TABLET | ORAL | Status: DC | PRN

## 2021-01-11 MED ORDER — PROPOFOL 500 MG/50ML IV EMUL
INTRAVENOUS | Status: DC | PRN
  Administered 2021-01-11: 150 ug/kg/min via INTRAVENOUS

## 2021-01-11 MED ORDER — SODIUM CHLORIDE 0.9 % IV SOLN
INTRAVENOUS | Status: AC
  Filled 2021-01-11: qty 2

## 2021-01-11 MED ORDER — ACETAMINOPHEN 160 MG/5ML PO SOLN: 325.0000 mg | ORAL | Status: DC | PRN

## 2021-01-11 MED ORDER — PROPOFOL 500 MG/50ML IV EMUL
INTRAVENOUS | Status: AC
  Filled 2021-01-11: qty 100

## 2021-01-11 MED ORDER — ONDANSETRON HCL 4 MG/2ML IJ SOLN
INTRAMUSCULAR | Status: DC | PRN
  Administered 2021-01-11: 4 mg via INTRAVENOUS

## 2021-01-11 MED ORDER — OXYCODONE HCL 5 MG/5ML PO SOLN
ORAL | Status: AC
  Filled 2021-01-11: qty 5

## 2021-01-11 MED ORDER — OXYCODONE HCL 5 MG PO TABS: 5.0000 mg | ORAL_TABLET | Freq: Once | ORAL | Status: AC | PRN

## 2021-01-11 MED ORDER — 0.9 % SODIUM CHLORIDE (POUR BTL) OPTIME
TOPICAL | Status: DC | PRN
Start: 2021-01-11 — End: 2021-01-11
  Administered 2021-01-11: 500 mL

## 2021-01-11 MED ORDER — DEXAMETHASONE SODIUM PHOSPHATE 4 MG/ML IJ SOLN
INTRAMUSCULAR | Status: DC | PRN
  Administered 2021-01-11: 8 mg via INTRAVENOUS

## 2021-01-11 MED ORDER — EPHEDRINE SULFATE 50 MG/ML IJ SOLN
INTRAMUSCULAR | Status: DC | PRN
  Administered 2021-01-11 (×4): 5 mg via INTRAVENOUS

## 2021-01-11 SURGICAL SUPPLY — 24 items
BLADE SURG 15 STRL LF DISP TIS (BLADE) ×2 IMPLANT
BLADE SURG 15 STRL SS (BLADE) ×3
DEVICE CAPIO SLIM SINGLE (INSTRUMENTS) ×3 IMPLANT
DRAPE STERI URO 9X17 APER PCH (DRAPES) ×3 IMPLANT
GAUZE 4X4 16PLY ~~LOC~~+RFID DBL (SPONGE) ×3 IMPLANT
GLOVE SURG POLYISO LF SZ6 (GLOVE) ×3 IMPLANT
GLOVE SURG UNDER POLY LF SZ6.5 (GLOVE) ×3 IMPLANT
GLOVE SURG UNDER POLY LF SZ7 (GLOVE) ×6 IMPLANT
GOWN STRL REUS W/TWL LRG LVL3 (GOWN DISPOSABLE) ×6 IMPLANT
HIBICLENS CHG 4% 4OZ (MISCELLANEOUS) ×3 IMPLANT
KIT TURNOVER CYSTO (KITS) ×3 IMPLANT
NEEDLE HYPO 22GX1.5 SAFETY (NEEDLE) ×3 IMPLANT
NEEDLE MAYO 6 CRC TAPER PT (NEEDLE) ×3 IMPLANT
NS IRRIG 500ML POUR BTL (IV SOLUTION) ×3 IMPLANT
PACK VAGINAL WOMENS (CUSTOM PROCEDURE TRAY) ×3 IMPLANT
RETRACTOR LONE STAR DISPOSABLE (INSTRUMENTS) ×3 IMPLANT
RETRACTOR STAY HOOK 5MM (MISCELLANEOUS) ×3 IMPLANT
SUT ABS MONO DBL WITH NDL 48IN (SUTURE) ×6 IMPLANT
SUT VIC AB 0 CT1 27 (SUTURE) ×3
SUT VIC AB 0 CT1 27XBRD ANTBC (SUTURE) ×2 IMPLANT
SUT VICRYL 2-0 SH 8X27 (SUTURE) ×3 IMPLANT
SYR BULB EAR ULCER 3OZ GRN STR (SYRINGE) ×3 IMPLANT
TOWEL OR 17X26 10 PK STRL BLUE (TOWEL DISPOSABLE) ×3 IMPLANT
TRAY FOLEY W/BAG SLVR 14FR LF (SET/KITS/TRAYS/PACK) ×3 IMPLANT

## 2021-01-11 NOTE — Discharge Instructions (Addendum)
Post Anesthesia Home Care Instructions  Activity: Get plenty of rest for the remainder of the day. A responsible individual must stay with you for 24 hours following the procedure.  For the next 24 hours, DO NOT: -Drive a car -Paediatric nurse -Drink alcoholic beverages -Take any medication unless instructed by your physician -Make any legal decisions or sign important papers.  Meals: Start with liquid foods such as gelatin or soup. Progress to regular foods as tolerated. Avoid greasy, spicy, heavy foods. If nausea and/or vomiting occur, drink only clear liquids until the nausea and/or vomiting subsides. Call your physician if vomiting continues.  Special Instructions/Symptoms: Your throat may feel dry or sore from the anesthesia or the breathing tube placed in your throat during surgery. If this causes discomfort, gargle with warm salt water. The discomfort should disappear within 24 hours.  **No acetaminophen or Tylenol until after 4pm today if needed  **No ibuprofen, Advil, Aleve, Motrin, ketorolac, meloxicam, or naproxen until after 7pm today if needed.    POST OPERATIVE INSTRUCTIONS  General Instructions Recovery (not bed rest) will last approximately 6 weeks Walking is encouraged, but refrain from strenuous exercise/ housework/ heavy lifting. No lifting >10lbs  Nothing in the vagina- NO intercourse, tampons or douching Bathing:  Do not submerge in water (NO swimming, bath, hot tub, etc) until after your postop visit. You can shower starting the day after surgery.  No driving until you are not taking narcotic pain medicine and until your pain is well enough controlled that you can slam on the breaks or make sudden movements if needed.   Taking your medications Please take your acetaminophen and ibuprofen on a schedule for the first 48 hours. Take '600mg'$  ibuprofen, then take '500mg'$  acetaminophen 3 hours later, then continue to alternate ibuprofen and acetaminophen. That way you  are taking each type of medication every 6 hours. Take the prescribed narcotic (oxycodone, tramadol, etc) as needed, with a maximum being every 4 hours.  Take a stool softener daily to keep your stools soft and preventing you from straining. If you have diarrhea, you decrease your stool softener. This is explained more below. We have prescribed you Miralax.  Reasons to Call the Nurse (see last page for phone numbers) Heavy Bleeding (changing your pad every 1-2 hours) Persistent nausea/vomiting Fever (100.4 degrees or more) Incision problems (pus or other fluid coming out, redness, warmth, increased pain)  Things to Expect After Surgery Mild to Moderate pain is normal during the first day or two after surgery. If prescribed, take Ibuprofen or Tylenol first and use the stronger medicine for "break-through" pain. You can overlap these medicines because they work differently.   Constipation   To Prevent Constipation:  Eat a well-balanced diet including protein, grains, fresh fruit and vegetables.  Drink plenty of fluids. Walk regularly.  Depending on specific instructions from your physician: take Miralax daily and additionally you can add a stool softener (colace/ docusate) and fiber supplement. Continue as long as you're on pain medications.   To Treat Constipation:  If you do not have a bowel movement in 2 days after surgery, you can take 2 Tbs of Milk of Magnesia 1-2 times a day until you have a bowel movement. If diarrhea occurs, decrease the amount or stop the laxative. If no results with Milk of Magnesia, you can drink a bottle of magnesium citrate which you can purchase over the counter.  Fatigue:  This is a normal response to surgery and will improve with time.  Plan  frequent rest periods throughout the day.  Gas Pain:  This is very common but can also be very painful! Drink warm liquids such as herbal teas, bouillon or soup. Walking will help you pass more gas.  Mylicon or Gas-X can be  taken over the counter.  Leaking Urine:  Varying amounts of leakage may occur after surgery.  This should improve with time. Your bladder needs at least 3 months to recover from surgery. If you leak after surgery, be sure to mention this to your doctor at your post-op visit. If you were taking medications for overactive bladder prior to surgery, be sure to restart the medications immediately after surgery.  Incisions: If you have incisions on your abdomen, the skin glue will dissolve on its own over time. It is ok to gently rinse with soap and water over these incisions but do not scrub.  Catheter Approximately 50% of patients are unable to urinate after surgery and need to go home with a catheter. This allows your bladder to rest so it can return to full function. If you go home with a catheter, the office will call to set up a voiding trial a few days after surgery. For most patients, by this visit, they are able to urinate on their own. Long term catheter use is rare.   Return to Work  As work demands and recovery times vary widely, it is hard to predict when you will want to return to work. If you have a desk job with no strenuous physical activity, and if you would like to return sooner than generally recommended, discuss this with your provider or call our office.   Post op concerns  For non-emergent issues, please call the Urogynecology Nurse. Please leave a message and someone will contact you within one business day.  You can also send a message through [facility].   AFTER HOURS (After 5:00 PM and on weekends):  For urgent matters that cannot wait until the next business day. Call our office [phone] and connect to the doctor on call.  Please reserve this for important issues.   **FOR ANY TRUE EMERGENCY ISSUES CALL 911 OR GO TO THE NEAREST EMERGENCY ROOM.** Please inform our office or the doctor on call of any emergency.     APPOINTMENTS: Call [phone]

## 2021-01-11 NOTE — Transfer of Care (Signed)
Immediate Anesthesia Transfer of Care Note  Patient: Kim Mann  Procedure(s) Performed: POSTERIOR REPAIR (RECTOCELE) with perineorrhaphy (Vagina ) SACROSPINOUS FIXATION  Patient Location: PACU  Anesthesia Type:General  Level of Consciousness: awake, alert  and patient cooperative  Airway & Oxygen Therapy: Patient Spontanous Breathing and Patient connected to nasal cannula oxygen  Post-op Assessment: Report given to RN and Post -op Vital signs reviewed and stable  Post vital signs: Reviewed and stable  Last Vitals:  Vitals Value Taken Time  BP    Temp    Pulse    Resp    SpO2      Last Pain:  Vitals:   01/11/21 1015  TempSrc: Oral  PainSc: 0-No pain      Patients Stated Pain Goal: 8 ([id number])  Complications: No notable events documented.

## 2021-01-11 NOTE — Anesthesia Preprocedure Evaluation (Signed)
Anesthesia Evaluation  Patient identified by MRN, date of birth, ID band Patient awake    Reviewed: Allergy & Precautions, NPO status , Patient's Chart, lab work & pertinent test results  Airway Mallampati: I       Dental no notable dental hx.    Pulmonary former smoker,    Pulmonary exam normal        Cardiovascular negative cardio ROS Normal cardiovascular exam     Neuro/Psych negative psych ROS   GI/Hepatic Neg liver ROS,   Endo/Other  negative endocrine ROS  Renal/GU negative Renal ROS  negative genitourinary   Musculoskeletal negative musculoskeletal ROS (+)   Abdominal Normal abdominal exam  (+)   Peds  Hematology negative hematology ROS (+)   Anesthesia Other Findings   Reproductive/Obstetrics                             Anesthesia Physical Anesthesia Plan  ASA: 2  Anesthesia Plan: General   Post-op Pain Management:    Induction: Intravenous  PONV Risk Score and Plan: 3 and Ondansetron, Dexamethasone, TIVA and Propofol infusion  Airway Management Planned: LMA  Additional Equipment: None  Intra-op Plan:   Post-operative Plan: Extubation in OR  Informed Consent: I have reviewed the patients History and Physical, chart, labs and discussed the procedure including the risks, benefits and alternatives for the proposed anesthesia with the patient or authorized representative who has indicated his/her understanding and acceptance.     Dental advisory given  Plan Discussed with: CRNA  Anesthesia Plan Comments:         Anesthesia Quick Evaluation

## 2021-01-11 NOTE — Op Note (Signed)
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 Stage II posterior and apical vaginal prolapse on vaginal exam with good reduction of prolapse post-operatively   Specimens: * No specimens in log *  Estimated blood loss: 100 mL  IV fluids: 900 mL  Urine output: [id number] mL  Complications: none  Procedure in Detail:  After informed consent was obtained, the patient was taken to the operating room where anesthesia was induced and found to be adequate. She was placed in dorsal lithotomy position, taking care to avoid any traction on the extremities, and then prepped and draped in the usual sterile fashion. A self-retaining lonestar retractor was placed using four elastic blue stays.  After a foley catheter was inserted into the urethra.  Vaginal exam concluded that an apical procedure was also needed. Two Allis clamps were along the posterior vaginal wall defect. 1% lidocaine with epinephrine was injected into the vaginal mucosa.  A vertical incision was made between these two Allis clamps with a 15 blade scalpel and a diamond shaped incision was made over the perineum.  Allis clamps were placed along this incision and Metzenbaum scissors were used to undermine the vaginal mucosa along the incision.  The vaginal mucosa was then sharply dissected off to the septum bilaterally.    For the sacrospinous ligament fixation (SSLF), the ischial spine was accessed on the right side via dissection with scissors and blunt dissection.  The sacrospinous ligament was palpated. Two 0 PDS suture was then placed at the sacrospinous ligament two fingerbreadths medial to the ischial spine, in order to avoid the pudendal neurovascular  bundle, using a Capio needle driver.  The PDS suture was attached to the vaginal epithelium on the ipsilateral side of the vaginal apex and held. Posterior plication of the rectovaginal septum was then performed first with a pursestring suture to reduce the enterocele then using plicating sutures of 2-0 Vicryl. The last distal stitch incorporated the perineal body in a U stitch fashion. The vaginal mucosal edges were trimmed and the incision reapproximated with 2-0 Vicryl in a running fashion. The SSLF suture was then tied down with excellent support of the posterior and apical vagina. The perineal body was then reapproximated with a 0-vicryl suture. The skin was then closed in a subcutaneous and subcuticular fashion.    The Foley catheter was removed. The vagina was copiously irrigated.  Hemostasis was noted.  Vaginal packing was not placed.  A rectal examination was normal and confirmed no sutures within the rectum. The patient tolerated the procedure well.  She was awakened from anesthesia and transferred to the recovery room in stable condition. All counts were correct x 2.    Jaquita Folds, MD

## 2021-01-11 NOTE — Anesthesia Postprocedure Evaluation (Signed)
Anesthesia Post Note  Patient: Kim Mann  Procedure(s) Performed: POSTERIOR REPAIR (RECTOCELE) with perineorrhaphy (Vagina ) SACROSPINOUS LIGAMENT FIXATION     Patient location during evaluation: PACU Anesthesia Type: General Level of consciousness: awake and sedated Pain management: pain level controlled Vital Signs Assessment: post-procedure vital signs reviewed and stable Respiratory status: spontaneous breathing Cardiovascular status: stable Postop Assessment: no apparent nausea or vomiting Anesthetic complications: no   No notable events documented.  Last Vitals:  Vitals:   01/11/21 1345 01/11/21 1400  BP: (!) 134/55 126/63  Pulse: 97 79  Resp: 17 (!) 21  Temp:  36.4 C  SpO2: 98% 94%    Last Pain:  Vitals:   01/11/21 1339  TempSrc:   PainSc: [place] Jr

## 2021-01-11 NOTE — Anesthesia Procedure Notes (Signed)
Procedure Name: LMA Insertion Date/Time: 01/11/2021 12:17 PM Performed by: Georgeanne Nim, CRNA Pre-anesthesia Checklist: Patient identified, Emergency Drugs available, Suction available, Patient being monitored and Timeout performed Patient Re-evaluated:Patient Re-evaluated prior to induction Oxygen Delivery Method: Circle system utilized Preoxygenation: Pre-oxygenation with 100% oxygen Induction Type: IV induction Ventilation: Mask ventilation without difficulty LMA: LMA inserted LMA Size: 4.0 Number of attempts: 1 Placement Confirmation: positive ETCO2, CO2 detector and breath sounds checked- equal and bilateral Tube secured with: Tape Dental Injury: Teeth and Oropharynx as per pre-operative assessment

## 2021-01-11 NOTE — Interval H&P Note (Signed)
History and Physical Interval Note:  01/11/2021 11:54 AM  Kim Mann  has presented today for surgery, with the diagnosis of posterior vaginal prolapse.  The various methods of treatment have been discussed with the patient and family. After consideration of risks, benefits and other options for treatment, the patient has consented to  Procedure(s) with comments: POSTERIOR REPAIR (RECTOCELE) with perineorrhaphy (N/A)  SACROSPINOUS FIXATION only (possible) (N/A) as a surgical intervention.  The patient's history has been reviewed, patient examined, no change in status, stable for surgery.  I have reviewed the patient's chart and labs.  Questions were answered to the patient's satisfaction.     Jaquita Folds

## 2021-01-12 ENCOUNTER — Telehealth: Payer: Self-pay | Admitting: Obstetrics and Gynecology

## 2021-01-12 ENCOUNTER — Encounter (HOSPITAL_BASED_OUTPATIENT_CLINIC_OR_DEPARTMENT_OTHER): Payer: Self-pay | Admitting: Obstetrics and Gynecology

## 2021-01-12 NOTE — Telephone Encounter (Signed)
Kim Mann underwent posterior repair, perineorrhaphy and sacrospinous ligament fixatin on 01/11/21.   She did not have a voiding trial   She was discharged without a catheter. Please call her for a routine post op check . Thanks!  Jaquita Folds, MD

## 2021-01-12 NOTE — Telephone Encounter (Signed)
Post- Op Call  Kim Mann underwent posterior repair, perineorrhaphy and sacrospinous ligament fixatin on 01/11/21 on 01/11/21 with Dr Wannetta Sender. The patient reports that her pain is controlled. She is taking Ibuprofen and Extra strength Tylenol. She reports minimal vaginal bleeding. She has had a bowel movement and is taking Miralax for a bowel regimen. She was discharged without a catheter.  Elita Quick, CMA

## 2021-01-14 NOTE — H&P (Signed)
Vitals:   01/11/21 1400 01/11/21 1430  BP: 126/63 (!) 150/60  Pulse: 79 77  Resp: (!) 21 18  Temp: 97.6 F (36.4 C) (!) 97.5 F (36.4 C)  SpO2: 94% 97%    Gen: NAD CV: S1 S2 RRR Lungs: Clear to auscultation bilaterally Abd: soft, nontender

## 2021-01-20 ENCOUNTER — Encounter: Payer: Self-pay | Admitting: *Deleted

## 2021-02-08 ENCOUNTER — Telehealth: Payer: Self-pay | Admitting: Obstetrics and Gynecology

## 2021-02-08 NOTE — Telephone Encounter (Signed)
Fluconizole 150mg  2 tab has been called into [facility] in [place]. Pt will be contacted Thursday to see id sx are better.  RE: Yeast sx Received: Today Megan Salon, MD  Elita Quick, CMA Caller: Unspecified (Today, 10:10 AM) Truddie Crumble,  It's ok to send in rx for fluconazole 150mg  po x 1, repeat in 72 hours.  #2/0RF.  If she is not a lot better in a few days, she needs to let you know so I can see her before the end of the week.  Thank you.   Edwinna Areola

## 2021-02-19 NOTE — Progress Notes (Signed)
[facility]  Date of Visit: 02/22/2021  History of Present Illness: Ms. Kim Mann is a 70 y.o. female scheduled today for a post-operative visit.   Surgery: s/p posterior repair, perineorrhaphy and sacrospinous ligament fixation on 01/11/21   She did not need a voiding trial.   Postoperative course has been complicated by a yeast infection- was prescribed diflucan.   Today she reports she still has some discharge, has to wear pads. Itching has improved.   UTI in the last 6 weeks? No  Pain? No  She has returned to her normal activity (except for postop restrictions) Vaginal bulge? No  Stress incontinence: No  Urgency/frequency: No  Urge incontinence: No  Voiding dysfunction: No  Bowel issues: No   Subjective Success: Do you usually have a bulge or something falling out that you can see or feel in the vaginal area? No  Retreatment Success: Any retreatment with surgery or pessary for any compartment? No    Medications: She has a current medication list which includes the following prescription(s): estradiol, acetaminophen, vitamin d3, ibuprofen, omeprazole, oxycodone, and polyethylene glycol 3350.   Allergies: Patient is allergic to latex and sulfa antibiotics.   Physical Exam: BP (!) 163/93   Pulse 79   Abdomen: soft, non-tender, without masses or organomegaly Pelvic Examination: Vagina: Incisions healing well. Sutures are present at incision line and vaginal cuff and there is some small amount of granulation tissue. No tenderness along the anterior or posterior vagina. No apical tenderness. No pelvic masses.   Aptima swab obtained  POP-Q: POP-Q  -2.5                                            Aa   -2.5                                           Ba  -7                                              C   3.5                                            Gh  4.5                                            Pb  7.5                                            tvl    -3                                            Ap  -3  Bp                                                 D    ---------------------------------------------------------  Assessment and Plan:  1. Post-operative state   2. Vaginal atrophy   3. Vaginal discharge     - The patient was given a copy of her operative note for her records. - Can resume regular activity including exercise and intercourse,  if desired.  - prescribed vaginal estrogen, estrace cream 0.5g nightly for two weeks then twice a week after to help with healing.  - Suspect discharge is due to wound healing but will send aptima swab to ensure no yeast or BV.  - Discussed avoidance of heavy lifting and straining long term to reduce the risk of recurrence.  - Will return to her GYN for annual exams  Return as needed.   Jaquita Folds, MD

## 2021-02-22 ENCOUNTER — Encounter: Payer: Self-pay | Admitting: Obstetrics and Gynecology

## 2021-02-22 ENCOUNTER — Other Ambulatory Visit: Payer: Self-pay

## 2021-02-22 ENCOUNTER — Other Ambulatory Visit (HOSPITAL_COMMUNITY)
Admission: RE | Admit: 2021-02-22 | Discharge: 2021-02-22 | Disposition: A | Discharge status: Home / Self Care | Payer: Medicare Other | Source: Ambulatory Visit | Attending: Obstetrics and Gynecology | Admitting: Obstetrics and Gynecology

## 2021-02-22 ENCOUNTER — Ambulatory Visit (INDEPENDENT_AMBULATORY_CARE_PROVIDER_SITE_OTHER): Payer: Medicare Other | Admitting: Obstetrics and Gynecology

## 2021-02-22 VITALS — BP 163/93 | HR 79

## 2021-02-22 DIAGNOSIS — N898 Other specified noninflammatory disorders of vagina: Secondary | ICD-10-CM | POA: Diagnosis present

## 2021-02-22 DIAGNOSIS — Z9889 Other specified postprocedural states: Secondary | ICD-10-CM

## 2021-02-22 DIAGNOSIS — N952 Postmenopausal atrophic vaginitis: Secondary | ICD-10-CM

## 2021-02-22 MED ORDER — ESTRADIOL 0.1 MG/GM VA CREA
0.5000 g | TOPICAL_CREAM | VAGINAL | 11 refills | Status: DC
Start: 2021-02-22 — End: 2022-06-06

## 2021-02-23 LAB — CERVICOVAGINAL ANCILLARY ONLY
Bacterial Vaginitis (gardnerella): NEGATIVE
Candida Glabrata: NEGATIVE
Candida Vaginitis: NEGATIVE
Comment: NEGATIVE
Comment: NEGATIVE
Comment: NEGATIVE

## 2021-03-24 ENCOUNTER — Ambulatory Visit: Payer: Medicare Other | Admitting: Obstetrics and Gynecology

## 2021-04-20 NOTE — Telephone Encounter (Signed)
#  3 ring w/support rcvd/charged 06/12/2020

## 2022-05-17 ENCOUNTER — Ambulatory Visit (INDEPENDENT_AMBULATORY_CARE_PROVIDER_SITE_OTHER): Payer: Medicare Other | Admitting: Obstetrics and Gynecology

## 2022-05-17 ENCOUNTER — Encounter: Payer: Self-pay | Admitting: Obstetrics and Gynecology

## 2022-05-17 VITALS — BP 140/83 | HR 69

## 2022-05-17 DIAGNOSIS — R310 Gross hematuria: Secondary | ICD-10-CM | POA: Diagnosis not present

## 2022-05-17 NOTE — Progress Notes (Signed)
[facility] Return Visit  SUBJECTIVE  History of Present Illness: Kim Mann is a 72 y.o. female s/p posterior repair, perineorrhaphy and sacrospinous ligament fixation on 01/11/21.   Last month she went to PCP due to increased urgency, frequency and blood in urine. She was diagnosed with a UTI (positive culture). She took cipro for a week. She then had more blood in urine, without other symptoms but urine culture was negative. She has a family history of kidney cancer. Denies history of kidney stones.   Does not feel the bulge is back from the surgery but feels maybe her bladder is dropping down a little. Has not had any constipation, has been eating a lot of fiber in her diet.   Past Medical History: Patient  has a past medical history of Chronic constipation, GERD (gastroesophageal reflux disease), History of Helicobacter pylori infection (10/2013), Pre-diabetes, and Prolapse of posterior vaginal wall.   Past Surgical History: She  has a past surgical history that includes Cataract extraction w/ intraocular lens implant (Bilateral, 2017); Strabismus surgery (Right, 02/19/2016); Adjustable suture manipulation (Right, 02/19/2016); Colonoscopy (N/A, 04/04/2017); Colonoscopy with propofol (N/A, 05/25/2020); polypectomy (05/25/2020); Total abdominal hysterectomy w/ bilateral salpingoophorectomy (02/15/1999); Cystocele repair (03/25/2003); Strabismus surgery (Right); Esophagogastroduodenoscopy (10/2013); Rectocele repair (N/A, 01/11/2021); and Anterior and posterior repair with sacrospinous fixation (N/A, 01/11/2021).   Medications: She has a current medication list which includes the following prescription(s): acetaminophen, vitamin d3, estradiol, ibuprofen, omeprazole, oxycodone, and polyethylene glycol 3350.   Allergies: Patient is allergic to latex and sulfa antibiotics.   Social History: Patient  reports that she quit smoking about 44 years ago. Her smoking use included  cigarettes. She has never used smokeless tobacco. She reports that she does not drink alcohol and does not use drugs.      OBJECTIVE     Physical Exam: Vitals:   05/17/22 1135 05/17/22 1136  BP: (!) 158/87 (!) 140/83  Pulse: 81 69   Gen: No apparent distress, A&O x 3.  Detailed Urogynecologic Evaluation:  Normal external genitalia, normal urethra. Speculum exam shows vaginal atrophy, no lesions present.   POP-Q  -2                                            Aa   -2                                           Ba  -5.5                                              C   3.5                                            Gh  6                                            Pb  6  tvl   -3                                            Ap  -3                                            Bp                                                 D      ASSESSMENT AND PLAN    Kim Mann is a 72 y.o. with:  1. Gross hematuria    - No signs of vaginal bleeding. Mild stage 1 anterior prolapse present, unchanged from prior exam. No posterior prolapse.  - For management of gross hematuria, we discussed the importance of work-up including assessing the upper and lower GU tract with CT urogram and cystoscopy. She will pursue this work-up and follow-up afterward to discuss the results and decide on a treatment plan based on the findings.   Jaquita Folds, MD

## 2022-06-02 ENCOUNTER — Encounter (HOSPITAL_COMMUNITY): Payer: Self-pay | Admitting: *Deleted

## 2022-06-02 ENCOUNTER — Emergency Department (HOSPITAL_COMMUNITY): Payer: Medicare Other

## 2022-06-02 ENCOUNTER — Emergency Department (HOSPITAL_COMMUNITY)
Admission: EM | Admit: 2022-06-02 | Discharge: 2022-06-02 | Disposition: A | Discharge status: Home / Self Care | Payer: Medicare Other | Attending: Emergency Medicine | Admitting: Emergency Medicine

## 2022-06-02 ENCOUNTER — Other Ambulatory Visit: Payer: Self-pay

## 2022-06-02 DIAGNOSIS — X509XXA Other and unspecified overexertion or strenuous movements or postures, initial encounter: Secondary | ICD-10-CM | POA: Insufficient documentation

## 2022-06-02 DIAGNOSIS — S82852A Displaced trimalleolar fracture of left lower leg, initial encounter for closed fracture: Secondary | ICD-10-CM | POA: Diagnosis not present

## 2022-06-02 DIAGNOSIS — Z91040 Latex allergy status: Secondary | ICD-10-CM | POA: Diagnosis not present

## 2022-06-02 DIAGNOSIS — S99912A Unspecified injury of left ankle, initial encounter: Secondary | ICD-10-CM | POA: Diagnosis present

## 2022-06-02 MED ORDER — OXYCODONE-ACETAMINOPHEN 5-325 MG PO TABS
1.0000 | ORAL_TABLET | Freq: Once | ORAL | Status: AC
  Administered 2022-06-02: 1 via ORAL
  Filled 2022-06-02: qty 1

## 2022-06-02 MED ORDER — OXYCODONE-ACETAMINOPHEN 5-325 MG PO TABS: 1.0000 | ORAL_TABLET | Freq: Four times a day (QID) | ORAL | 0 refills | Status: DC | PRN

## 2022-06-02 NOTE — ED Provider Notes (Signed)
[facility] Provider Note   CSN: [id number] Arrival date & time: 06/02/22  1540     History  No chief complaint on file.   Kim Mann is a 72 y.o. female.  Patient presents to the emergency permit today for evaluation of left ankle pain, acute onset, starting just prior to arrival.  Patient had a slip and fall while walking at her home.  Her leg went out from underneath her.  She twisted her ankle and had immediate pain and swelling.  She has been unable to ambulate on the ankle.  EMS was called for transport.  She was placed in a leg splint and administered 10 mg of morphine for pain.  She denies head or neck injury.  No knee or hip pain.  She has had good pulses per EMS.  Patient denies other medical conditions.  Denies anticoagulation.  She did not hit her head or hurt her neck.       Home Medications Prior to Admission medications   Medication Sig Start Date End Date Taking? Authorizing Provider  acetaminophen (TYLENOL) 500 MG tablet Take 1 tablet (500 mg total) by mouth every 6 (six) hours as needed (pain). 01/08/21   Jaquita Folds, MD  Cholecalciferol (VITAMIN D3) 50 MCG (2000 UT) TABS Take 2,000 Units by mouth daily.    [provider]  estradiol (ESTRACE) 0.1 MG/GM vaginal cream Place 0.5 g vaginally 2 (two) times a week. Place 0.5g nightly for two weeks then twice a week after 02/22/21   Jaquita Folds, MD  ibuprofen (ADVIL) 600 MG tablet Take 1 tablet (600 mg total) by mouth every 6 (six) hours as needed. 01/08/21   Jaquita Folds, MD  omeprazole (PRILOSEC OTC) 20 MG tablet Take 20 mg by mouth daily.    [provider]  oxyCODONE (OXY IR/ROXICODONE) 5 MG immediate release tablet Take 1 tablet (5 mg total) by mouth every 4 (four) hours as needed for severe pain. 01/08/21   Jaquita Folds, MD  Polyethylene Glycol 3350 (MIRALAX PO) Take by mouth daily.    [provider]      Allergies    Latex and  Sulfa antibiotics    Review of Systems   Review of Systems  Physical Exam Updated Vital Signs There were no vitals taken for this visit. Physical Exam Vitals and nursing note reviewed.  Constitutional:      Appearance: She is well-developed.  HENT:     Head: Normocephalic and atraumatic.  Eyes:     Pupils: Pupils are equal, round, and reactive to light.  Cardiovascular:     Pulses: Normal pulses. No decreased pulses.          Dorsalis pedis pulses are 2+ on the left side.       Posterior tibial pulses are 2+ on the left side.     Comments: Normal left pedal pulses. Musculoskeletal:        General: Tenderness present.     Cervical back: Normal range of motion and neck supple.     Left hip: No tenderness.     Left knee: Normal range of motion. No tenderness.     Left ankle: Swelling present. Tenderness present over the lateral malleolus and medial malleolus. No proximal fibula tenderness. Decreased range of motion.     Left foot: Normal range of motion. No tenderness.  Skin:    General: Skin is warm and dry.  Neurological:     Mental Status:  She is alert.     Sensory: No sensory deficit.     Comments: Motor, sensation, and vascular distal to the injury is fully intact.   Psychiatric:        Mood and Affect: Mood normal.     ED Results / Procedures / Treatments   Labs (all labs ordered are listed, but only abnormal results are displayed) Labs Reviewed - No data to display  EKG None  Radiology No results found.  Procedures Procedures    Medications Ordered in ED Medications - No data to display  ED Course/ Medical Decision Making/ A&P    Patient seen and examined. History obtained directly from patient.   Labs/EKG: None ordered  Imaging: Ordered x-ray of the left ankle, possible fracture, low concern for dislocation.    Medications/Fluids: None ordered.  Patient received morphine prior to arrival.  Most recent vital signs reviewed and are as  follows: BP (!) 144/66   Pulse 87   Temp 98 F (36.7 C) (Oral)   Resp 16   SpO2 100%   Initial impression: Lower extremities neurovascularly intact at time of exam.  7:00 PM Reassessment performed. Patient appears stable.  Pain controlled. Seen earlier at bedside with Dr. Truett Mainland.   Imaging personally visualized and interpreted including: X-ray, prior to and after splinting, agree fracture, likely trimalleolar.  Reviewed pertinent lab work and imaging with patient at bedside. Questions answered.  Distal CMS intact after splint placement, recently checked.  Most current vital signs reviewed and are as follows: BP (!) 144/66   Pulse 87   Temp 98 F (36.7 C) (Oral)   Resp 16   SpO2 100%   Plan: Discharge to home.   Prescriptions written for: Percocet 5-325 mg #10 tabs  Other home care instructions discussed: RICE protocol, over the counter medications  ED return instructions discussed: Worsening pain, new symptoms or other concerns.  Follow-up instructions discussed: Patient encouraged to follow-up with with orthopedics in the next 5 days.                            Medical Decision Making Amount and/or Complexity of Data Reviewed Radiology: ordered.  Risk Prescription drug management.   Patient with fall, no head or neck injury suspected.  She has a ankle fracture which will require outpatient follow-up.  No sign of vascular injury.  Distal sensation intact.  No signs of developing compartment syndrome during ED stay.  No knee or hip injury suspected.  The patient's vital signs, pertinent lab work and imaging were reviewed and interpreted as discussed in the ED course. Hospitalization was considered for further testing, treatments, or serial exams/observation. However as patient is well-appearing, has a stable exam, and reassuring studies today, I do not feel that they warrant admission at this time. This plan was discussed with the patient who verbalizes agreement and  comfort with this plan and seems reliable and able to return to the Emergency Department with worsening or changing symptoms.          Final Clinical Impression(s) / ED Diagnoses Final diagnoses:  Closed trimalleolar fracture of left ankle, initial encounter    Rx / DC Orders ED Discharge Orders          Ordered    oxyCODONE-acetaminophen (PERCOCET/ROXICET) 5-325 MG tablet  Every 6 hours PRN        06/02/22 1859  Carlisle Cater, PA-C 06/02/22 1902    Cristie Hem, MD 06/03/22 [phone]

## 2022-06-02 NOTE — ED Triage Notes (Signed)
Pt brought in by [facility] for c/o ankle pain due to fall; pt states she mis-stepped and caused her to fall; pt has swelling to left ankle  Pt given morphine '10mg'$  IV en route by ems

## 2022-06-02 NOTE — Discharge Instructions (Signed)
Please read and follow all provided instructions.  Your diagnoses today include:  1. Closed trimalleolar fracture of left ankle, initial encounter    Tests performed today include: An x-ray of the affected area - shows broken ankle without dislocation Vital signs. See below for your results today.   Medications prescribed:  Percocet (oxycodone/acetaminophen) - narcotic pain medication  DO NOT drive or perform any activities that require you to be awake and alert because this medicine can make you drowsy. BE VERY CAREFUL not to take multiple medicines containing Tylenol (also called acetaminophen). Doing so can lead to an overdose which can damage your liver and cause liver failure and possibly death.  Take any prescribed medications only as directed.  Home care instructions:  Follow any educational materials contained in this packet Follow R.I.C.E. Protocol: R - rest your injury  I  - use ice on injury without applying directly to skin C - compress injury with bandage or splint E - elevate the injury as much as possible  Follow-up instructions: Please follow-up with the provided orthopedic physician (bone specialist) in 5 days.   Return instructions:  Please return if your toes or feet are numb or tingling, appear gray or blue, or you have severe pain (also elevate the leg and loosen splint or wrap if you were given one) Please return to the Emergency Department if you experience worsening symptoms.  Please return if you have any other emergent concerns.  Additional Information:  Your vital signs today were: BP (!) 144/66   Pulse 87   Temp 98 F (36.7 C) (Oral)   Resp 16   SpO2 100%  If your blood pressure (BP) was elevated above 135/85 this visit, please have this repeated by your doctor within one month. --------------

## 2022-06-06 ENCOUNTER — Telehealth: Payer: Self-pay | Admitting: Orthopedic Surgery

## 2022-06-06 ENCOUNTER — Encounter: Payer: Self-pay | Admitting: Orthopedic Surgery

## 2022-06-06 ENCOUNTER — Ambulatory Visit (INDEPENDENT_AMBULATORY_CARE_PROVIDER_SITE_OTHER): Payer: Medicare Other | Admitting: Orthopedic Surgery

## 2022-06-06 VITALS — BP 139/62 | HR 88 | Ht 66.5 in | Wt 154.0 lb

## 2022-06-06 DIAGNOSIS — S82852A Displaced trimalleolar fracture of left lower leg, initial encounter for closed fracture: Secondary | ICD-10-CM

## 2022-06-06 DIAGNOSIS — Z01818 Encounter for other preprocedural examination: Secondary | ICD-10-CM

## 2022-06-06 MED ORDER — OXYCODONE HCL 5 MG PO TABS: 5.0000 mg | ORAL_TABLET | Freq: Four times a day (QID) | ORAL | 0 refills | Status: DC | PRN

## 2022-06-06 NOTE — H&P (View-Only) (Signed)
New Patient Visit  Assessment: Kim Mann is a 72 y.o. female with the following: 1. Closed trimalleolar fracture of left ankle, initial encounter  Plan: Kim Mann sustained a left trimalleolar ankle fracture dislocation, which was reduced in the emergency department.  Patient is very active.  Due to the extent of the injury, I recommended operative fixation.  This will include plate and screw construct laterally, with use medially.  We will evaluate the ankle fully at that time.  The procedure and recovery was discussed in great detail.  All questions were answered.  Will plan to proceed with surgery on 06/13/2022.  She is to continue to elevate the foot is much as possible, to help with swelling, in order to ensure that it is safe to proceed with surgery is a 20-day.  She states her understanding.  We provided her with a prescription for a wheelchair, with a leg rest.  If she has any further issues, she will contact the clinic.  Refill of oxycodone was provided.  Risks and benefits of surgery, including, but not limited to infection, bleeding, persistent pain, damage to surrounding structures, need for further surgery, malunion, nonunion and more severe complications associated with anesthesia were discussed.  All questions have been answered and they have elected to proceed with surgery.    Surgery will be scheduled for 06/13/2022.  Follow-up: Return for After surgery.  Subjective:  Chief Complaint  Patient presents with   New Patient (Initial Visit)   Ankle Injury    LT ankle fracture DOI 06/02/22 s/p fall Pain 3/10 patient took oxycodone ~830am    History of Present Illness: Kim Mann is a 72 y.o. female who presents for evaluation of left ankle pain.  She states she is leaving her house on January 18, when she lost her balance, while stepping on a rug.  The rug slipped.  She twisted her ankle.  She had immediate deformity.  She called EMS.  She was brought to the  emergency department.  Radiographs demonstrated a trimalleolar ankle fracture dislocation.  This was reduced in the ED.  She has been in a splint ever since.  She is taking oxycodone for pain, and states it is helpful.  If she rests her foot in a dependent position, she notes instant throbbing.  She reports that she is very active.  She walks 2-3 miles on a daily basis.  She also lifts weights.  She is active with gardening.   Review of Systems: No fevers or chills No numbness or tingling No chest pain No shortness of breath No bowel or bladder dysfunction No GI distress No headaches   Medical History:  Past Medical History:  Diagnosis Date   Chronic constipation    GERD (gastroesophageal reflux disease)    History of Helicobacter pylori infection 10/2013   per EGD w/ bx,  treated   Pre-diabetes    Prolapse of posterior vaginal wall     Past Surgical History:  Procedure Laterality Date   ADJUSTABLE SUTURE MANIPULATION Right 02/19/2016   Procedure: ADJUSTABLE SUTURE MANIPULATION;  Surgeon: Everitt Amber, MD;  Location: [place];  Service: Ophthalmology;  Laterality: Right;   ANTERIOR AND POSTERIOR REPAIR WITH SACROSPINOUS FIXATION N/A 01/11/2021   Procedure: SACROSPINOUS LIGAMENT FIXATION;  Surgeon: Jaquita Folds, MD;  Location: [facility];  Service: Gynecology;  Laterality: N/A;   CATARACT EXTRACTION W/ INTRAOCULAR LENS IMPLANT Bilateral 2017   COLONOSCOPY N/A 04/04/2017   Procedure: COLONOSCOPY;  Surgeon:  Fields, Marga Melnick, MD;  [facility];  Service: Endoscopy;  Laterality: N/A;  12:45pm - LM for pt to arrive at 12:15   COLONOSCOPY WITH PROPOFOL N/A 05/25/2020   Internal hemorrhoids, many small and large-mouthed diverticula in sigmoid, descending, and transverse colon. 3 mm polyp in sigmoid. 10 mm polyp in transverse. 3 year surveillance. Sessile serrated polyps.    CYSTOCELE REPAIR  03/25/2003   '@[facility]'$ ;  Anterior Colporrhaphy    ESOPHAGOGASTRODUODENOSCOPY  10/2013   POLYPECTOMY  05/25/2020   Procedure: POLYPECTOMY;  Surgeon: Eloise Harman, DO;  [facility];  Service: Endoscopy;;   RECTOCELE REPAIR N/A 01/11/2021   Procedure: POSTERIOR REPAIR (RECTOCELE) with perineorrhaphy;  Surgeon: Jaquita Folds, MD;  Location: [place];  Service: Gynecology;  Laterality: N/A;   STRABISMUS SURGERY Right 02/19/2016   Procedure: REPAIR STRABISMUS;  Surgeon: Everitt Amber, MD;  Location: [place];  Service: Ophthalmology;  Laterality: Right;   STRABISMUS SURGERY Right    age 9 and 56   TOTAL ABDOMINAL HYSTERECTOMY W/ BILATERAL SALPINGOOPHORECTOMY  02/15/1999   '@[facility]'$  (operative record in care everywhere)  ;  Culdoplasty /  Burch Cystourethropexy/  Posterior Colporrhaphy    Family History  Problem Relation Age of Onset   Brain cancer Mother 36   Heart failure Father 65   Colon cancer Sister 13   Colon polyps Neg Hx    Social History   Tobacco Use   Smoking status: Former    Years: 15.00    Types: Cigarettes    Quit date: 1980    Years since quitting: 44.0   Smokeless tobacco: Never  Vaping Use   Vaping Use: Never used  Substance Use Topics   Alcohol use: No   Drug use: No    Allergies  Allergen Reactions   Latex Rash   Sulfa Antibiotics Rash    Current Meds  Medication Sig   Cholecalciferol (VITAMIN D3) 50 MCG (2000 UT) TABS Take 2,000 Units by mouth daily.   ibuprofen (ADVIL) 600 MG tablet Take 1 tablet (600 mg total) by mouth every 6 (six) hours as needed.   omeprazole (PRILOSEC OTC) 20 MG tablet Take 20 mg by mouth daily.   oxyCODONE (ROXICODONE) 5 MG immediate release tablet Take 1 tablet (5 mg total) by mouth every 6 (six) hours as needed for up to 7 days.   Polyethylene Glycol 3350 (MIRALAX PO) Take by mouth daily.   [DISCONTINUED] oxyCODONE-acetaminophen (PERCOCET/ROXICET) 5-325 MG tablet Take 1 tablet by mouth every 6 (six) hours as needed for  severe pain.    Objective: BP 139/62   Pulse 88   Ht 5' 6.5" (1.689 m)   Wt 154 lb (69.9 kg)   BMI 24.48 kg/m   Physical Exam:  General: Alert and oriented., No acute distress., and Seated in a wheelchair. Gait: Unable to ambulate.  Left ankle in a well-padded splint.  No skin breakdown at the periphery.  Splint is clean, dry and intact.  Toes warm and well-perfused.  There is some swelling of the exposed toes.  She is able to wiggle her exposed toes.  Mild swelling in the dorsum of the foot.  Sensation is intact to her toes.  IMAGING: I personally reviewed images previously obtained from the ED  X-rays of the left ankle demonstrate a displaced trimalleolar ankle fracture, with improved alignment following splinting.   New Medications:  Meds ordered this encounter  Medications   oxyCODONE (ROXICODONE) 5 MG immediate release  tablet    Sig: Take 1 tablet (5 mg total) by mouth every 6 (six) hours as needed for up to 7 days.    Dispense:  20 tablet    Refill:  0      [name], MD  06/06/2022 12:02 PM

## 2022-06-06 NOTE — Telephone Encounter (Signed)
Spoke with [facility]. Clarified patient only needs a leg rest for her wheelchair that will allow her to elevate her leg while sitting in the chair.

## 2022-06-06 NOTE — Patient Instructions (Signed)
Elevate the leg as much as possible  Plan for surgery 06/13/22

## 2022-06-06 NOTE — Telephone Encounter (Signed)
[facility] [phone] called [facility] stating they need clarification regarding the script for the leg rest/wheel chair.

## 2022-06-06 NOTE — Progress Notes (Signed)
New Patient Visit  Assessment: Kim Mann is a 72 y.o. female with the following: 1. Closed trimalleolar fracture of left ankle, initial encounter  Plan: Kim Mann sustained a left trimalleolar ankle fracture dislocation, which was reduced in the emergency department.  Patient is very active.  Due to the extent of the injury, I recommended operative fixation.  This will include plate and screw construct laterally, with use medially.  We will evaluate the ankle fully at that time.  The procedure and recovery was discussed in great detail.  All questions were answered.  Will plan to proceed with surgery on 06/13/2022.  She is to continue to elevate the foot is much as possible, to help with swelling, in order to ensure that it is safe to proceed with surgery is a 20-day.  She states her understanding.  We provided her with a prescription for a wheelchair, with a leg rest.  If she has any further issues, she will contact the clinic.  Refill of oxycodone was provided.  Risks and benefits of surgery, including, but not limited to infection, bleeding, persistent pain, damage to surrounding structures, need for further surgery, malunion, nonunion and more severe complications associated with anesthesia were discussed.  All questions have been answered and they have elected to proceed with surgery.    Surgery will be scheduled for 06/13/2022.  Follow-up: Return for After surgery.  Subjective:  Chief Complaint  Patient presents with   New Patient (Initial Visit)   Ankle Injury    LT ankle fracture DOI 06/02/22 s/p fall Pain 3/10 patient took oxycodone ~830am    History of Present Illness: Kim Mann is a 72 y.o. female who presents for evaluation of left ankle pain.  She states she is leaving her house on January 18, when she lost her balance, while stepping on a rug.  The rug slipped.  She twisted her ankle.  She had immediate deformity.  She called EMS.  She was brought to the  emergency department.  Radiographs demonstrated a trimalleolar ankle fracture dislocation.  This was reduced in the ED.  She has been in a splint ever since.  She is taking oxycodone for pain, and states it is helpful.  If she rests her foot in a dependent position, she notes instant throbbing.  She reports that she is very active.  She walks 2-3 miles on a daily basis.  She also lifts weights.  She is active with gardening.   Review of Systems: No fevers or chills No numbness or tingling No chest pain No shortness of breath No bowel or bladder dysfunction No GI distress No headaches   Medical History:  Past Medical History:  Diagnosis Date   Chronic constipation    GERD (gastroesophageal reflux disease)    History of Helicobacter pylori infection 10/2013   per EGD w/ bx,  treated   Pre-diabetes    Prolapse of posterior vaginal wall     Past Surgical History:  Procedure Laterality Date   ADJUSTABLE SUTURE MANIPULATION Right 02/19/2016   Procedure: ADJUSTABLE SUTURE MANIPULATION;  Surgeon: Everitt Amber, MD;  Location: [place];  Service: Ophthalmology;  Laterality: Right;   ANTERIOR AND POSTERIOR REPAIR WITH SACROSPINOUS FIXATION N/A 01/11/2021   Procedure: SACROSPINOUS LIGAMENT FIXATION;  Surgeon: Jaquita Folds, MD;  Location[facility];  Service: Gynecology;  Laterality: N/A;   CATARACT EXTRACTION W/ INTRAOCULAR LENS IMPLANT Bilateral 2017   COLONOSCOPY N/A 04/04/2017   Procedure: COLONOSCOPY;  Surgeon:  Fields, Marga Melnick, MD;  [facility];  Service: Endoscopy;  Laterality: N/A;  12:45pm - LM for pt to arrive at 12:15   COLONOSCOPY WITH PROPOFOL N/A 05/25/2020   Internal hemorrhoids, many small and large-mouthed diverticula in sigmoid, descending, and transverse colon. 3 mm polyp in sigmoid. 10 mm polyp in transverse. 3 year surveillance. Sessile serrated polyps.    CYSTOCELE REPAIR  03/25/2003   '@[facility]'$ ;  Anterior Colporrhaphy    ESOPHAGOGASTRODUODENOSCOPY  10/2013   POLYPECTOMY  05/25/2020   Procedure: POLYPECTOMY;  Surgeon: Eloise Harman, DO;  [facility];  Service: Endoscopy;;   RECTOCELE REPAIR N/A 01/11/2021   Procedure: POSTERIOR REPAIR (RECTOCELE) with perineorrhaphy;  Surgeon: Jaquita Folds, MD;  Location: [place];  Service: Gynecology;  Laterality: N/A;   STRABISMUS SURGERY Right 02/19/2016   Procedure: REPAIR STRABISMUS;  Surgeon: Everitt Amber, MD;  Location: [place];  Service: Ophthalmology;  Laterality: Right;   STRABISMUS SURGERY Right    age 53 and 20   TOTAL ABDOMINAL HYSTERECTOMY W/ BILATERAL SALPINGOOPHORECTOMY  02/15/1999   '@[facility]'$  (operative record in care everywhere)  ;  Culdoplasty /  Burch Cystourethropexy/  Posterior Colporrhaphy    Family History  Problem Relation Age of Onset   Brain cancer Mother 52   Heart failure Father 68   Colon cancer Sister 52   Colon polyps Neg Hx    Social History   Tobacco Use   Smoking status: Former    Years: 15.00    Types: Cigarettes    Quit date: 1980    Years since quitting: 44.0   Smokeless tobacco: Never  Vaping Use   Vaping Use: Never used  Substance Use Topics   Alcohol use: No   Drug use: No    Allergies  Allergen Reactions   Latex Rash   Sulfa Antibiotics Rash    Current Meds  Medication Sig   Cholecalciferol (VITAMIN D3) 50 MCG (2000 UT) TABS Take 2,000 Units by mouth daily.   ibuprofen (ADVIL) 600 MG tablet Take 1 tablet (600 mg total) by mouth every 6 (six) hours as needed.   omeprazole (PRILOSEC OTC) 20 MG tablet Take 20 mg by mouth daily.   oxyCODONE (ROXICODONE) 5 MG immediate release tablet Take 1 tablet (5 mg total) by mouth every 6 (six) hours as needed for up to 7 days.   Polyethylene Glycol 3350 (MIRALAX PO) Take by mouth daily.   [DISCONTINUED] oxyCODONE-acetaminophen (PERCOCET/ROXICET) 5-325 MG tablet Take 1 tablet by mouth every 6 (six) hours as needed for  severe pain.    Objective: BP 139/62   Pulse 88   Ht 5' 6.5" (1.689 m)   Wt 154 lb (69.9 kg)   BMI 24.48 kg/m   Physical Exam:  General: Alert and oriented., No acute distress., and Seated in a wheelchair. Gait: Unable to ambulate.  Left ankle in a well-padded splint.  No skin breakdown at the periphery.  Splint is clean, dry and intact.  Toes warm and well-perfused.  There is some swelling of the exposed toes.  She is able to wiggle her exposed toes.  Mild swelling in the dorsum of the foot.  Sensation is intact to her toes.  IMAGING: I personally reviewed images previously obtained from the ED  X-rays of the left ankle demonstrate a displaced trimalleolar ankle fracture, with improved alignment following splinting.   New Medications:  Meds ordered this encounter  Medications   oxyCODONE (ROXICODONE) 5 MG immediate release  tablet    Sig: Take 1 tablet (5 mg total) by mouth every 6 (six) hours as needed for up to 7 days.    Dispense:  20 tablet    Refill:  0      [name], MD  06/06/2022 12:02 PM

## 2022-06-07 NOTE — Patient Instructions (Signed)
Kim Mann  06/07/2022     '@PREFPERIOPPHARMACY'$ @   Your procedure is scheduled on  06/13/2022.   Report to [facility] at  [place]   Call this number if you have problems the morning of surgery:  [phone]  If you experience any cold or flu symptoms such as cough, fever, chills, shortness of breath, etc. between now and your scheduled surgery, please notify us at the above number.   Remember:  Do not eat after midnight.   You may drink clear liquids until  0520 am on 06/13/2022.    Clear liquids allowed are:                    Water, Juice (No red color; non-citric and without pulp; diabetics please choose diet or no sugar options), Carbonated beverages (diabetics please choose diet or no sugar options), Clear Tea (No creamer, milk, or cream, including half & half and powdered creamer), Black Coffee Only (No creamer, milk or cream, including half & half and powdered creamer), Plain Jell-O Only (No red color; diabetics please choose no sugar options), Clear Sports drink (No red color; diabetics please choose diet or no sugar options), and Plain Popsicles Only (No red color; diabetics please choose no sugar options)        At 0520 am on 06/13/2022 drink your carb drink. You can have nothing else to drink after this.    Take these medicines the morning of surgery with A SIP OF WATER            omeprazole, oxycodone(if needed).     Do not wear jewelry, make-up or nail polish.  Do not wear lotions, powders, or perfumes, or deodorant.  Do not shave 48 hours prior to surgery.  Men may shave face and neck.  Do not bring valuables to the hospital.  [facility] is not responsible for any belongings or valuables.  Contacts, dentures or bridgework may not be worn into surgery.  Leave your suitcase in the car.  After surgery it may be brought to your room.  For patients admitted to the hospital, discharge time will be determined by your treatment team.  Patients  discharged the day of surgery will not be allowed to drive home and must have someone with them for 24 hours.    Special instructions:   DO NOT smoke tobacco or vape for 24 hours before your procedure.  Please read over the following fact sheets that you were given. Pain Booklet, Coughing and Deep Breathing, Surgical Site Infection Prevention, Anesthesia Post-op Instructions, and Care and Recovery After Surgery       Displaced Trimalleolar Ankle Fracture Treated With ORIF, Care After This sheet gives you information about how to care for yourself after your procedure. Your health care provider may also give you more specific instructions. If you have problems or questions, contact your health care provider. What can I expect after the procedure? After the procedure, it is common to have: Pain. Swelling. A small amount of fluid from your incision. Follow these instructions at home: If you have a splint or boot: Wear the splint or boot as told by your health care provider. Remove it only as told by your health care provider. Loosen the splint or boot if your toes tingle, become numb, or turn cold and blue. Keep the splint or boot clean and dry. If you have a cast: Do not stick anything  inside the cast to scratch your skin. Doing that increases your risk of infection. Check the skin around the cast every day. Tell your health care provider about any concerns. You may put lotion on dry skin around the edges of the cast. Do not put lotion on the skin underneath the cast. Keep the cast clean and dry. Bathing Do not take baths, swim, or use a hot tub until your health care provider approves. Ask your health care provider if you can take showers. If your splint, boot, or cast is not waterproof: Do not let it get wet. Cover it with a watertight covering when you take a bath or a shower. Keep your bandage (dressing) dry until your health care provider says it can be removed. Incision  care  Follow instructions from your health care provider about how to take care of your incision. Make sure you: Wash your hands with soap and water for at least 20 seconds before and after you change your dressing. If soap and water are not available, use hand sanitizer. Change your dressing as told by your health care provider. Leave stitches (sutures), skin glue, or adhesive strips in place. These skin closures may need to stay in place for 2 weeks or longer. If adhesive strip edges start to loosen and curl up, you may trim the loose edges. Do not remove adhesive strips completely unless your health care provider tells you to do that. Check your incision area every day for signs of infection. Check for: Redness. More pain or swelling. Blood or more fluid. Warmth. Pus or a bad smell. Managing pain, stiffness, and swelling  If directed, put ice on the affected area. To do this: If you have a removable splint or boot, remove it as told by your health care provider. Put ice in a plastic bag. Place a towel between your skin and the bag or between your cast and the bag. Leave the ice on for 20 minutes, 2-3 times a day. Remove the ice if your skin turns bright red. This is very important. If you cannot feel pain, heat, or cold, you have a greater risk of damage to the area. Move your toes often to reduce stiffness and swelling. Raise (elevate) the injured area above the level of your heart while you are sitting or lying down. To do this, try putting a few pillows under your leg and ankle. Activity Do exercises as told by your health care provider or physical therapist. Do not use your injured limb to support (bear) your body weight until your health care provider says that you can. Follow weight-bearing restrictions as told. Use crutches or other devices to help you move around (assistive devices) as told by your health care provider. Ask your health care provider when it is safe to drive if you  have a splint, boot, or cast on your foot. Return to your normal activities as told by your health care provider. Ask your health care provider what activities are safe for you. General instructions Do not put pressure on any part of the splint or cast until it is fully hardened. This may take several hours. Do not use any products that contain nicotine or tobacco, such as cigarettes and e-cigarettes. These can delay bone healing. If you need help quitting, ask your health care provider. Take over-the-counter and prescription medicines only as told by your health care provider. Ask your health care provider if the medicine prescribed to you: Requires you to avoid driving or  using machinery. Can cause constipation. You may need to take these actions to prevent or treat constipation: Drink enough fluid to keep your urine pale yellow. Take over-the-counter or prescription medicines. Eat foods that are high in fiber, such as beans, whole grains, and fresh fruits and vegetables. Limit foods that are high in fat and processed sugars, such as fried or sweet foods. Keep all follow-up visits. This is important. Contact a health care provider if: You have a fever. Your pain medicine is not helping. You have redness around your incision. You have more pain or swelling around your incision. You have blood or more fluid coming from your incision or leaking through your cast. Your incision feels warm to the touch. You have pus or a bad smell coming from your incision or from your cast or dressing. Get help right away if: The edges of your incision come apart after the sutures or staples have been taken out. You have chest pain or trouble breathing. You have numbness or tingling in your foot or leg. Your foot becomes cold, pale, or blue. You have pain or swelling in your calf. These symptoms may represent a serious problem that is an emergency. Do not wait to see if the symptoms will go away. Get  medical help right away. Call your local emergency services (911 in the [place] Do not drive yourself to the hospital. Summary After the procedure, it is common to have some pain and swelling. If your splint, boot, or cast is not waterproof, do not let it get wet. Contact your health care provider if you have more pain or swelling, or if you have blood or more fluid coming from your incision or leaking through your cast. Get help right away if you have numbness or tingling in your foot or leg, or if your foot becomes cold, pale, or blue. This information is not intended to replace advice given to you by your health care provider. Make sure you discuss any questions you have with your health care provider. Document Revised: 09/02/2019 Document Reviewed: 09/02/2019 Elsevier Patient Education  [place] Anesthesia, Adult, Care After The following information offers guidance on how to care for yourself after your procedure. Your health care provider may also give you more specific instructions. If you have problems or questions, contact your health care provider. What can I expect after the procedure? After the procedure, it is common for people to: Have pain or discomfort at the IV site. Have nausea or vomiting. Have a sore throat or hoarseness. Have trouble concentrating. Feel cold or chills. Feel weak, sleepy, or tired (fatigue). Have soreness and body aches. These can affect parts of the body that were not involved in surgery. Follow these instructions at home: For the time period you were told by your health care provider:  Rest. Do not participate in activities where you could fall or become injured. Do not drive or use machinery. Do not drink alcohol. Do not take sleeping pills or medicines that cause drowsiness. Do not make important decisions or sign legal documents. Do not take care of children on your own. General instructions Drink enough fluid to keep your  urine pale yellow. If you have sleep apnea, surgery and certain medicines can increase your risk for breathing problems. Follow instructions from your health care provider about wearing your sleep device: Anytime you are sleeping, including during daytime naps. While taking prescription pain medicines, sleeping medicines, or medicines that make you drowsy. Return to your  normal activities as told by your health care provider. Ask your health care provider what activities are safe for you. Take over-the-counter and prescription medicines only as told by your health care provider. Do not use any products that contain nicotine or tobacco. These products include cigarettes, chewing tobacco, and vaping devices, such as e-cigarettes. These can delay incision healing after surgery. If you need help quitting, ask your health care provider. Contact a health care provider if: You have nausea or vomiting that does not get better with medicine. You vomit every time you eat or drink. You have pain that does not get better with medicine. You cannot urinate or have bloody urine. You develop a skin rash. You have a fever. Get help right away if: You have trouble breathing. You have chest pain. You vomit blood. These symptoms may be an emergency. Get help right away. Call 911. Do not wait to see if the symptoms will go away. Do not drive yourself to the hospital. Summary After the procedure, it is common to have a sore throat, hoarseness, nausea, vomiting, or to feel weak, sleepy, or fatigue. For the time period you were told by your health care provider, do not drive or use machinery. Get help right away if you have difficulty breathing, have chest pain, or vomit blood. These symptoms may be an emergency. This information is not intended to replace advice given to you by your health care provider. Make sure you discuss any questions you have with your health care provider. Document Revised: 07/30/2021  Document Reviewed: 07/30/2021 Elsevier Patient Education  [place]. How to Use Chlorhexidine Before Surgery Chlorhexidine gluconate (CHG) is a germ-killing (antiseptic) solution that is used to clean the skin. It can get rid of the bacteria that normally live on the skin and can keep them away for about 24 hours. To clean your skin with CHG, you may be given: A CHG solution to use in the shower or as part of a sponge bath. A prepackaged cloth that contains CHG. Cleaning your skin with CHG may help lower the risk for infection: While you are staying in the intensive care unit of the hospital. If you have a vascular access, such as a central line, to provide short-term or long-term access to your veins. If you have a catheter to drain urine from your bladder. If you are on a ventilator. A ventilator is a machine that helps you breathe by moving air in and out of your lungs. After surgery. What are the risks? Risks of using CHG include: A skin reaction. Hearing loss, if CHG gets in your ears and you have a perforated eardrum. Eye injury, if CHG gets in your eyes and is not rinsed out. The CHG product catching fire. Make sure that you avoid smoking and flames after applying CHG to your skin. Do not use CHG: If you have a chlorhexidine allergy or have previously reacted to chlorhexidine. On babies younger than 67 months of age. How to use CHG solution Use CHG only as told by your health care provider, and follow the instructions on the label. Use the full amount of CHG as directed. Usually, this is one bottle. During a shower Follow these steps when using CHG solution during a shower (unless your health care provider gives you different instructions): Start the shower. Use your normal soap and shampoo to wash your face and hair. Turn off the shower or move out of the shower stream. Pour the CHG onto a clean  washcloth. Do not use any type of brush or rough-edged sponge. Starting at  your neck, lather your body down to your toes. Make sure you follow these instructions: If you will be having surgery, pay special attention to the part of your body where you will be having surgery. Scrub this area for at least 1 minute. Do not use CHG on your head or face. If the solution gets into your ears or eyes, rinse them well with water. Avoid your genital area. Avoid any areas of skin that have broken skin, cuts, or scrapes. Scrub your back and under your arms. Make sure to wash skin folds. Let the lather sit on your skin for 1-2 minutes or as long as told by your health care provider. Thoroughly rinse your entire body in the shower. Make sure that all body creases and crevices are rinsed well. Dry off with a clean towel. Do not put any substances on your body afterward--such as powder, lotion, or perfume--unless you are told to do so by your health care provider. Only use lotions that are recommended by the manufacturer. Put on clean clothes or pajamas. If it is the night before your surgery, sleep in clean sheets.  During a sponge bath Follow these steps when using CHG solution during a sponge bath (unless your health care provider gives you different instructions): Use your normal soap and shampoo to wash your face and hair. Pour the CHG onto a clean washcloth. Starting at your neck, lather your body down to your toes. Make sure you follow these instructions: If you will be having surgery, pay special attention to the part of your body where you will be having surgery. Scrub this area for at least 1 minute. Do not use CHG on your head or face. If the solution gets into your ears or eyes, rinse them well with water. Avoid your genital area. Avoid any areas of skin that have broken skin, cuts, or scrapes. Scrub your back and under your arms. Make sure to wash skin folds. Let the lather sit on your skin for 1-2 minutes or as long as told by your health care provider. Using a  different clean, wet washcloth, thoroughly rinse your entire body. Make sure that all body creases and crevices are rinsed well. Dry off with a clean towel. Do not put any substances on your body afterward--such as powder, lotion, or perfume--unless you are told to do so by your health care provider. Only use lotions that are recommended by the manufacturer. Put on clean clothes or pajamas. If it is the night before your surgery, sleep in clean sheets. How to use CHG prepackaged cloths Only use CHG cloths as told by your health care provider, and follow the instructions on the label. Use the CHG cloth on clean, dry skin. Do not use the CHG cloth on your head or face unless your health care provider tells you to. When washing with the CHG cloth: Avoid your genital area. Avoid any areas of skin that have broken skin, cuts, or scrapes. Before surgery Follow these steps when using a CHG cloth to clean before surgery (unless your health care provider gives you different instructions): Using the CHG cloth, vigorously scrub the part of your body where you will be having surgery. Scrub using a back-and-forth motion for 3 minutes. The area on your body should be completely wet with CHG when you are done scrubbing. Do not rinse. Discard the cloth and let the area air-dry. Do not  put any substances on the area afterward, such as powder, lotion, or perfume. Put on clean clothes or pajamas. If it is the night before your surgery, sleep in clean sheets.  For general bathing Follow these steps when using CHG cloths for general bathing (unless your health care provider gives you different instructions). Use a separate CHG cloth for each area of your body. Make sure you wash between any folds of skin and between your fingers and toes. Wash your body in the following order, switching to a new cloth after each step: The front of your neck, shoulders, and chest. Both of your arms, under your arms, and your  hands. Your stomach and groin area, avoiding the genitals. Your right leg and foot. Your left leg and foot. The back of your neck, your back, and your buttocks. Do not rinse. Discard the cloth and let the area air-dry. Do not put any substances on your body afterward--such as powder, lotion, or perfume--unless you are told to do so by your health care provider. Only use lotions that are recommended by the manufacturer. Put on clean clothes or pajamas. Contact a health care provider if: Your skin gets irritated after scrubbing. You have questions about using your solution or cloth. You swallow any chlorhexidine. Call your local poison control center (1-928-211-9192 in the [place] Get help right away if: Your eyes itch badly, or they become very red or swollen. Your skin itches badly and is red or swollen. Your hearing changes. You have trouble seeing. You have swelling or tingling in your mouth or throat. You have trouble breathing. These symptoms may represent a serious problem that is an emergency. Do not wait to see if the symptoms will go away. Get medical help right away. Call your local emergency services (911 in the [place] Do not drive yourself to the hospital. Summary Chlorhexidine gluconate (CHG) is a germ-killing (antiseptic) solution that is used to clean the skin. Cleaning your skin with CHG may help to lower your risk for infection. You may be given CHG to use for bathing. It may be in a bottle or in a prepackaged cloth to use on your skin. Carefully follow your health care provider's instructions and the instructions on the product label. Do not use CHG if you have a chlorhexidine allergy. Contact your health care provider if your skin gets irritated after scrubbing. This information is not intended to replace advice given to you by your health care provider. Make sure you discuss any questions you have with your health care provider. Document Revised: 08/30/2021 Document  Reviewed: 07/13/2020 Elsevier Patient Education  [place].

## 2022-06-08 ENCOUNTER — Other Ambulatory Visit: Payer: Self-pay

## 2022-06-08 ENCOUNTER — Encounter (HOSPITAL_COMMUNITY): Payer: Self-pay

## 2022-06-08 ENCOUNTER — Encounter (HOSPITAL_COMMUNITY)
Admission: RE | Admit: 2022-06-08 | Discharge: 2022-06-08 | Disposition: A | Discharge status: Home / Self Care | Payer: Medicare Other | Source: Ambulatory Visit | Attending: Orthopedic Surgery | Admitting: Orthopedic Surgery

## 2022-06-08 VITALS — BP 158/77 | HR 105 | Temp 98.2°F | Resp 16 | Ht 66.5 in | Wt 154.0 lb

## 2022-06-08 DIAGNOSIS — S82852A Displaced trimalleolar fracture of left lower leg, initial encounter for closed fracture: Secondary | ICD-10-CM

## 2022-06-08 DIAGNOSIS — Z01818 Encounter for other preprocedural examination: Secondary | ICD-10-CM

## 2022-06-08 DIAGNOSIS — Y939 Activity, unspecified: Secondary | ICD-10-CM | POA: Diagnosis not present

## 2022-06-08 DIAGNOSIS — R7303 Prediabetes: Secondary | ICD-10-CM | POA: Diagnosis not present

## 2022-06-08 DIAGNOSIS — Z01812 Encounter for preprocedural laboratory examination: Secondary | ICD-10-CM | POA: Diagnosis not present

## 2022-06-08 DIAGNOSIS — Y929 Unspecified place or not applicable: Secondary | ICD-10-CM | POA: Diagnosis not present

## 2022-06-08 LAB — CBC
HCT: 38.2 % (ref 36.0–46.0)
Hemoglobin: 13.2 g/dL (ref 12.0–15.0)
MCH: 30.8 pg (ref 26.0–34.0)
MCHC: 34.6 g/dL (ref 30.0–36.0)
MCV: 89 fL (ref 80.0–100.0)
Platelets: 351 10*3/uL (ref 150–400)
RBC: 4.29 MIL/uL (ref 3.87–5.11)
RDW: 12.2 % (ref 11.5–15.5)
WBC: 8.1 10*3/uL (ref 4.0–10.5)
nRBC: 0 % (ref 0.0–0.2)

## 2022-06-08 LAB — BASIC METABOLIC PANEL
Anion gap: 12 (ref 5–15)
BUN: 18 mg/dL (ref 8–23)
CO2: 25 mmol/L (ref 22–32)
Calcium: 9 mg/dL (ref 8.9–10.3)
Chloride: 97 mmol/L — ABNORMAL LOW (ref 98–111)
Creatinine, Ser: 0.9 mg/dL (ref 0.44–1.00)
GFR, Estimated: >60 mL/min (ref >60)
Glucose, Bld: 203 mg/dL — ABNORMAL HIGH (ref 70–99)
Potassium: 3.6 mmol/L (ref 3.5–5.1)
Sodium: 134 mmol/L — ABNORMAL LOW (ref 135–145)

## 2022-06-08 LAB — HEMOGLOBIN A1C
Hgb A1c MFr Bld: 6.1 % — ABNORMAL HIGH (ref 4.8–5.6)
Mean Plasma Glucose: 128.37 mg/dL

## 2022-06-13 ENCOUNTER — Other Ambulatory Visit: Payer: Self-pay

## 2022-06-13 ENCOUNTER — Ambulatory Visit (HOSPITAL_COMMUNITY)
Admission: RE | Admit: 2022-06-13 | Discharge: 2022-06-13 | Disposition: A | Discharge status: Home / Self Care | Payer: Medicare Other | Attending: Orthopedic Surgery | Admitting: Orthopedic Surgery

## 2022-06-13 ENCOUNTER — Ambulatory Visit (HOSPITAL_BASED_OUTPATIENT_CLINIC_OR_DEPARTMENT_OTHER): Payer: Medicare Other | Admitting: Anesthesiology

## 2022-06-13 ENCOUNTER — Ambulatory Visit (HOSPITAL_COMMUNITY): Payer: Medicare Other

## 2022-06-13 ENCOUNTER — Encounter (HOSPITAL_COMMUNITY)
Admission: RE | Disposition: A | Discharge status: Home / Self Care | Payer: Self-pay | Source: Home / Self Care | Attending: Orthopedic Surgery

## 2022-06-13 ENCOUNTER — Ambulatory Visit (HOSPITAL_COMMUNITY): Payer: Medicare Other | Admitting: Anesthesiology

## 2022-06-13 DIAGNOSIS — G709 Myoneural disorder, unspecified: Secondary | ICD-10-CM | POA: Diagnosis not present

## 2022-06-13 DIAGNOSIS — X501XXA Overexertion from prolonged static or awkward postures, initial encounter: Secondary | ICD-10-CM | POA: Insufficient documentation

## 2022-06-13 DIAGNOSIS — Z87891 Personal history of nicotine dependence: Secondary | ICD-10-CM | POA: Insufficient documentation

## 2022-06-13 DIAGNOSIS — K219 Gastro-esophageal reflux disease without esophagitis: Secondary | ICD-10-CM | POA: Insufficient documentation

## 2022-06-13 DIAGNOSIS — Z79899 Other long term (current) drug therapy: Secondary | ICD-10-CM | POA: Insufficient documentation

## 2022-06-13 DIAGNOSIS — S82852A Displaced trimalleolar fracture of left lower leg, initial encounter for closed fracture: Secondary | ICD-10-CM | POA: Insufficient documentation

## 2022-06-13 HISTORY — PX: ORIF ANKLE FRACTURE: SHX5408

## 2022-06-13 LAB — GLUCOSE, CAPILLARY: Glucose-Capillary: 135 mg/dL — ABNORMAL HIGH (ref 70–99)

## 2022-06-13 SURGERY — OPEN REDUCTION INTERNAL FIXATION (ORIF) ANKLE FRACTURE
Anesthesia: General | Site: Ankle | Laterality: Left

## 2022-06-13 MED ORDER — MELOXICAM 15 MG PO TABS: 15.0000 mg | ORAL_TABLET | Freq: Every day | ORAL | 0 refills | Status: DC

## 2022-06-13 MED ORDER — DEXAMETHASONE SODIUM PHOSPHATE 10 MG/ML IJ SOLN
INTRAMUSCULAR | Status: DC | PRN
  Administered 2022-06-13: 10 mg via INTRAVENOUS

## 2022-06-13 MED ORDER — ACETAMINOPHEN 500 MG PO TABS: 1000.0000 mg | ORAL_TABLET | Freq: Three times a day (TID) | ORAL | 0 refills | Status: AC

## 2022-06-13 MED ORDER — PHENYLEPHRINE HCL (PRESSORS) 10 MG/ML IV SOLN
INTRAVENOUS | Status: AC
  Filled 2022-06-13: qty 1

## 2022-06-13 MED ORDER — ONDANSETRON HCL 4 MG PO TABS: 4.0000 mg | ORAL_TABLET | Freq: Three times a day (TID) | ORAL | 0 refills | Status: AC | PRN

## 2022-06-13 MED ORDER — PROPOFOL 10 MG/ML IV BOLUS
INTRAVENOUS | Status: AC
  Filled 2022-06-13: qty 20

## 2022-06-13 MED ORDER — ONDANSETRON 4 MG PO TBDP
ORAL_TABLET | ORAL | Status: AC
  Filled 2022-06-13: qty 1

## 2022-06-13 MED ORDER — CEFAZOLIN SODIUM-DEXTROSE 2-4 GM/100ML-% IV SOLN
2.0000 g | INTRAVENOUS | Status: AC
  Administered 2022-06-13: 2 g via INTRAVENOUS

## 2022-06-13 MED ORDER — ONDANSETRON HCL 4 MG/2ML IJ SOLN: 4.0000 mg | Freq: Once | INTRAMUSCULAR | Status: DC | PRN

## 2022-06-13 MED ORDER — ONDANSETRON 4 MG PO TBDP
4.0000 mg | ORAL_TABLET | Freq: Once | ORAL | Status: AC
  Administered 2022-06-13: 4 mg via ORAL

## 2022-06-13 MED ORDER — FENTANYL CITRATE (PF) 100 MCG/2ML IJ SOLN
INTRAMUSCULAR | Status: AC
  Filled 2022-06-13: qty 2

## 2022-06-13 MED ORDER — FENTANYL CITRATE (PF) 100 MCG/2ML IJ SOLN
INTRAMUSCULAR | Status: DC | PRN
  Administered 2022-06-13: 25 ug via INTRAVENOUS
  Administered 2022-06-13 (×2): 50 ug via INTRAVENOUS
  Administered 2022-06-13: 25 ug via INTRAVENOUS
  Administered 2022-06-13: 50 ug via INTRAVENOUS

## 2022-06-13 MED ORDER — HYDROMORPHONE HCL 1 MG/ML IJ SOLN
0.2500 mg | INTRAMUSCULAR | Status: AC | PRN
  Administered 2022-06-13 (×4): 0.5 mg via INTRAVENOUS
  Filled 2022-06-13 (×4): qty 0.5

## 2022-06-13 MED ORDER — BUPIVACAINE-EPINEPHRINE (PF) 0.5% -1:200000 IJ SOLN
INTRAMUSCULAR | Status: AC
  Filled 2022-06-13: qty 30

## 2022-06-13 MED ORDER — OXYCODONE HCL 5 MG PO TABS: 5.0000 mg | ORAL_TABLET | ORAL | 0 refills | Status: DC | PRN

## 2022-06-13 MED ORDER — ONDANSETRON HCL 4 MG/2ML IJ SOLN
INTRAMUSCULAR | Status: DC | PRN
  Administered 2022-06-13: 4 mg via INTRAVENOUS

## 2022-06-13 MED ORDER — BUPIVACAINE-EPINEPHRINE (PF) 0.5% -1:200000 IJ SOLN
INTRAMUSCULAR | Status: DC | PRN
  Administered 2022-06-13: 30 mL via PERINEURAL

## 2022-06-13 MED ORDER — LACTATED RINGERS IV SOLN: INTRAVENOUS | Status: DC

## 2022-06-13 MED ORDER — MIDAZOLAM HCL 2 MG/2ML IJ SOLN
INTRAMUSCULAR | Status: AC
  Filled 2022-06-13: qty 2

## 2022-06-13 MED ORDER — ASPIRIN 81 MG PO TBEC: 81.0000 mg | DELAYED_RELEASE_TABLET | Freq: Two times a day (BID) | ORAL | 0 refills | Status: AC

## 2022-06-13 MED ORDER — MIDAZOLAM HCL 2 MG/2ML IJ SOLN
INTRAMUSCULAR | Status: DC | PRN
  Administered 2022-06-13: 1 mg via INTRAVENOUS

## 2022-06-13 MED ORDER — MIDAZOLAM HCL 2 MG/2ML IJ SOLN: 2.0000 mg | Freq: Once | INTRAMUSCULAR | Status: DC

## 2022-06-13 MED ORDER — OXYCODONE HCL 5 MG PO TABS
5.0000 mg | ORAL_TABLET | Freq: Once | ORAL | Status: AC
  Administered 2022-06-13: 5 mg via ORAL

## 2022-06-13 MED ORDER — OXYCODONE HCL 5 MG PO TABS
ORAL_TABLET | ORAL | Status: AC
  Filled 2022-06-13: qty 1

## 2022-06-13 MED ORDER — PROPOFOL 10 MG/ML IV BOLUS
INTRAVENOUS | Status: DC | PRN
  Administered 2022-06-13: 120 mg via INTRAVENOUS

## 2022-06-13 MED ORDER — LACTATED RINGERS IV SOLN: INTRAVENOUS | Status: DC | PRN

## 2022-06-13 MED ORDER — CEFAZOLIN SODIUM-DEXTROSE 2-4 GM/100ML-% IV SOLN
INTRAVENOUS | Status: AC
  Filled 2022-06-13: qty 100

## 2022-06-13 MED ORDER — DIPHENHYDRAMINE HCL 50 MG/ML IJ SOLN
INTRAMUSCULAR | Status: AC
  Filled 2022-06-13: qty 1

## 2022-06-13 MED ORDER — ORAL CARE MOUTH RINSE: 15.0000 mL | Freq: Once | OROMUCOSAL | Status: DC

## 2022-06-13 MED ORDER — DIPHENHYDRAMINE HCL 50 MG/ML IJ SOLN
25.0000 mg | Freq: Once | INTRAMUSCULAR | Status: AC
  Administered 2022-06-13: 25 mg via INTRAVENOUS

## 2022-06-13 MED ORDER — 0.9 % SODIUM CHLORIDE (POUR BTL) OPTIME
TOPICAL | Status: DC | PRN
  Administered 2022-06-13: 1000 mL

## 2022-06-13 MED ORDER — LIDOCAINE 2% (20 MG/ML) 5 ML SYRINGE
INTRAMUSCULAR | Status: DC | PRN
  Administered 2022-06-13: 40 mg via INTRAVENOUS

## 2022-06-13 MED ORDER — CHLORHEXIDINE GLUCONATE 0.12 % MT SOLN: 15.0000 mL | Freq: Once | OROMUCOSAL | Status: DC

## 2022-06-13 SURGICAL SUPPLY — 68 items
ADH SKN CLS APL DERMABOND .7 (GAUZE/BANDAGES/DRESSINGS) ×1
APL PRP STRL LF DISP 70% ISPRP (MISCELLANEOUS) ×2
BANDAGE ESMARK 4X12 BL STRL LF (DISPOSABLE) ×1 IMPLANT
BIT DRILL 2 CANN GRADUATED (BIT) IMPLANT
BIT DRILL 2.5 CANN LNG (BIT) IMPLANT
BIT DRILL 2.6 CANN (BIT) IMPLANT
BIT DRILL 2.7 (BIT) ×1
BIT DRILL 2.7X2.7/3XSCR ANKL (BIT) IMPLANT
BIT DRL 2.7X2.7/3XSCR ANKL (BIT) ×1
BLADE SURG SZ10 CARB STEEL (BLADE) IMPLANT
BNDG CMPR 12X4 ELC STRL LF (DISPOSABLE) ×1
BNDG CMPR 5X4 CHSV STRCH STRL (GAUZE/BANDAGES/DRESSINGS) ×1
BNDG COHESIVE 4X5 TAN STRL LF (GAUZE/BANDAGES/DRESSINGS) IMPLANT
BNDG ELASTIC 4X5.8 VLCR STR LF (GAUZE/BANDAGES/DRESSINGS) IMPLANT
BNDG ESMARK 4X12 BLUE STRL LF (DISPOSABLE) ×1
CHLORAPREP W/TINT 26 (MISCELLANEOUS) ×1 IMPLANT
CLOTH BEACON ORANGE TIMEOUT ST (SAFETY) ×1 IMPLANT
COVER LIGHT HANDLE STERIS (MISCELLANEOUS) ×2 IMPLANT
COVER MAYO STAND XLG (MISCELLANEOUS) IMPLANT
CUFF TOURN SGL QUICK 24 (TOURNIQUET CUFF) ×1
CUFF TRNQT CYL 24X4X40X1 (TOURNIQUET CUFF) IMPLANT
DERMABOND ADVANCED .7 DNX12 (GAUZE/BANDAGES/DRESSINGS) IMPLANT
DRAPE C-ARM FOLDED MOBILE STRL (DRAPES) ×1 IMPLANT
DRAPE C-ARMOR (DRAPES) ×1 IMPLANT
ELECT REM PT RETURN 9FT ADLT (ELECTROSURGICAL) ×1
ELECTRODE REM PT RTRN 9FT ADLT (ELECTROSURGICAL) ×1 IMPLANT
GAUZE SPONGE 4X4 12PLY STRL (GAUZE/BANDAGES/DRESSINGS) ×1 IMPLANT
GAUZE XEROFORM 1X8 LF (GAUZE/BANDAGES/DRESSINGS) ×1 IMPLANT
GLOVE BIOGEL PI IND STRL 7.0 (GLOVE) ×2 IMPLANT
GLOVE BIOGEL PI IND STRL 7.5 (GLOVE) IMPLANT
GLOVE SKINSENSE STRL SZ8.0 LF (GLOVE) IMPLANT
GLOVE SRG 8 PF TXTR STRL LF DI (GLOVE) ×1 IMPLANT
GLOVE SURG SS PI 7.0 STRL IVOR (GLOVE) IMPLANT
GLOVE SURG UNDER POLY LF SZ8 (GLOVE) ×1
GOWN STRL REUS W/ TWL XL LVL3 (GOWN DISPOSABLE) ×1 IMPLANT
GOWN STRL REUS W/TWL LRG LVL3 (GOWN DISPOSABLE) ×2 IMPLANT
GOWN STRL REUS W/TWL XL LVL3 (GOWN DISPOSABLE) ×1
GUIDEWIRE 1.35MM (WIRE) IMPLANT
INST SET MINOR BONE (KITS) ×1 IMPLANT
K-WIRE BB-TAK (WIRE) ×2
KIT TURNOVER KIT A (KITS) ×1 IMPLANT
KWIRE BB-TAK (WIRE) IMPLANT
MANIFOLD NEPTUNE II (INSTRUMENTS) ×1 IMPLANT
NDL HYPO 21X1.5 SAFETY (NEEDLE) IMPLANT
NEEDLE HYPO 21X1.5 SAFETY (NEEDLE) ×1 IMPLANT
NS IRRIG 1000ML POUR BTL (IV SOLUTION) ×1 IMPLANT
PACK BASIC LIMB (CUSTOM PROCEDURE TRAY) ×1 IMPLANT
PAD ABD 5X9 TENDERSORB (GAUZE/BANDAGES/DRESSINGS) ×4 IMPLANT
PAD ARMBOARD 7.5X6 YLW CONV (MISCELLANEOUS) ×1 IMPLANT
PAD CAST 4YDX4 CTTN HI CHSV (CAST SUPPLIES) ×1 IMPLANT
PADDING CAST COTTON 4X4 STRL (CAST SUPPLIES) ×2
PLATE DIST FB LT 5H (Plate) IMPLANT
SCREW BN T10 FT 20X2.7XST CORT (Screw) IMPLANT
SCREW COMP KREULOCK 2.7X10 (Screw) IMPLANT
SCREW COMP KREULOCK 2.7X12 (Screw) IMPLANT
SCREW COMP KREULOCK 2.7X14 (Screw) IMPLANT
SCREW CORT 2.7X20 (Screw) ×1 IMPLANT
SCREW LOCKING CANN 4X55 (Screw) IMPLANT
SCREW LOW PROFILE 3.5X14 (Screw) IMPLANT
SCREW NON-LOCKING 3.5X12MM (Screw) IMPLANT
SET BASIN LINEN APH (SET/KITS/TRAYS/PACK) ×1 IMPLANT
SPLINT PLASTER CAST XFAST 5X30 (CAST SUPPLIES) IMPLANT
SPONGE T-LAP 18X18 ~~LOC~~+RFID (SPONGE) ×1 IMPLANT
STRIP CLOSURE SKIN 1/2X4 (GAUZE/BANDAGES/DRESSINGS) IMPLANT
SUT MNCRL AB 4-0 PS2 18 (SUTURE) IMPLANT
SUT MON AB 2-0 CT1 36 (SUTURE) IMPLANT
SYR 30ML LL (SYRINGE) IMPLANT
SYR BULB IRRIG 60ML STRL (SYRINGE) ×1 IMPLANT

## 2022-06-13 NOTE — Anesthesia Procedure Notes (Signed)
Procedure Name: LMA Insertion Date/Time: 06/13/2022 7:36 AM  Performed by: Cynda Familia, CRNAPre-anesthesia Checklist: Patient identified, Emergency Drugs available, Suction available and Patient being monitored Patient Re-evaluated:Patient Re-evaluated prior to induction Oxygen Delivery Method: Circle System Utilized Preoxygenation: Pre-oxygenation with 100% oxygen Induction Type: IV induction Ventilation: Mask ventilation without difficulty LMA: LMA inserted and LMA with gastric port inserted LMA Size: 4.0 Number of attempts: 1 Airway Equipment and Method: Bite block Placement Confirmation: positive ETCO2 Tube secured with: Tape Dental Injury: Teeth and Oropharynx as per pre-operative assessment  Comments: Smooth IV induction -- LMA inserted --atraumatic-- teeth and mouth as preop-- bilat BS

## 2022-06-13 NOTE — Anesthesia Postprocedure Evaluation (Signed)
Anesthesia Post Note  Patient: Kim Mann  Procedure(s) Performed: OPEN REDUCTION INTERNAL FIXATION (ORIF) ANKLE FRACTURE (Left: Ankle)  Patient location during evaluation: Phase II Anesthesia Type: General Level of consciousness: awake and alert and oriented Pain management: pain level controlled Vital Signs Assessment: post-procedure vital signs reviewed and stable Respiratory status: spontaneous breathing, nonlabored ventilation and respiratory function stable Cardiovascular status: blood pressure returned to baseline and stable Postop Assessment: no apparent nausea or vomiting Anesthetic complications: no  No notable events documented.   Last Vitals:  Vitals:   06/13/22 1045 06/13/22 1054  BP:  (!) 151/86  Pulse: 87 89  Resp: 19 16  Temp:  36.6 C  SpO2: 100% 98%    Last Pain:  Vitals:   06/13/22 1054  TempSrc: Oral  PainSc: 7                  Janneth Krasner C Kraig Genis

## 2022-06-13 NOTE — Op Note (Signed)
Orthopaedic Surgery Operative Note (CSN: [id number])  Kim Mann  03-Dec-1950 Date of Surgery: 06/13/2022   Diagnoses:  Left trimalleolar ankle fracture  Procedure: Operative fixation of left trimalleolar ankle fracture without fixation of posterior lip (CPT [phone])   Operative Finding Successful completion of the planned procedure.  Plate a screw construct distal fibula with percutaneous fixation of medial malleolus fracture.  Posterior malleolus fracture fragment was less than 25% of joint line and the ankle was stable.  No fixation of the posterior malleolus fracture.    Post-Op Diagnosis: Same Surgeons:Primary: [name], MD Assistants: Vevelyn Royals Location: [facility] OR ROOM 4 Anesthesia: General with local anesthesia Antibiotics: Ancef 2 g Tourniquet time:  Total Tourniquet Time Documented: Thigh (Left) - 68 minutes Total: Thigh (Left) - 68 minutes  Estimated Blood Loss: 20 cc Complications: None Specimens: None  Implants: Implant Name Type Inv. Item Serial No. Manufacturer Lot No. LRB No. Used Action  SCREW NON-LOCKING 3.5X12MM - [id number] Screw SCREW NON-LOCKING 3.5X12MM  [employer] STERILE ON SET Left 2 Implanted  SCREW COMP KREULOCK 2.7X14 - [id number] Screw SCREW COMP KREULOCK 2.7X14  [employer] STERILE ON SET Left 2 Implanted  SCREW COMP KREULOCK 2.7X10 - [id number] Screw SCREW COMP KREULOCK 2.7X10  [employer] STERILE ON SET Left 1 Implanted  SCREW COMP KREULOCK 2.7X12 - [id number] Screw SCREW COMP KREULOCK 2.7X12  [employer] STERILE ON SET Left 1 Implanted  SCREW CORTICAL 2.7X10 - [id number] Screw SCREW CORTICAL 2.7X10  [employer] STERILE ON SET Left 1 Implanted  SCREW NON-LOCKING 3.5X20 ANKLE - [id number] Screw SCREW NON-LOCKING 3.5X20 ANKLE  [employer] STERILE ON SET Left 1 Implanted  SCREW LOW PROFILE 3.5X14 - [id number] Screw SCREW LOW PROFILE 3.5X14  [employer] STERILE ON SET Left 1 Implanted  SCREW CORT 2.7X20 - [id number] Screw SCREW CORT 2.7X20   [employer] STERILE ON SET Left 1 Implanted  SCREW LOCKING CANN 4X55 - [id number] Screw SCREW LOCKING CANN 4X55  [employer] STERILE ON SET Left 2 Implanted  PLATE DIST FB LT 5H - [id number] Plate PLATE DIST FB LT 5H  [employer] STERILE ON SET Left 1 Implanted    Indications for Surgery:   Kim Mann is a 72 y.o. female who fell, and sustained a left trimalleolar ankle fracture dislocation.  Fracture was reduced in the emergency department.  She was evaluated in clinic, and we discussed proceeding with surgery, in order to restore form and function.  Benefits and risks of operative and nonoperative management were discussed prior to surgery with the patient and informed consent form was completed.  Specific risks including infection, need for additional surgery, bleeding, nonunion, malunion, persistent pain, stiffness and more severe complications associated with anesthesia.  She elected to proceed.  Surgical consent was finalized.   Procedure:   The patient was identified properly. Informed consent was obtained and the surgical site was marked. The patient was taken to the OR where general anesthesia was induced.  The patient was positioned supine, with her leg on bone foam.  The left leg was prepped and draped in the usual sterile fashion.  Timeout was performed before the beginning of the case.  Tourniquet was used for the above duration.  We started by making an incision laterally, directly over the distal fibula.  This extended from the tip of the fibula, proximally, in order to sufficiently expose the fracture.  We used fluoroscopy to assist with location of the incision.  We incised sharply through skin.  We  then dissected bluntly to ensure that we did not encounter the nerve.  Once we had exposed enough, and did not visualize the nerve, we then used a knife to sharply incise down to the lateral aspect of the fibula.  We are able to see the fracture site.  There was an oblique fracture  through the distal fibula.  The fracture was opened, and callus was removed with a combination of rongeurs, freer elevator and irrigation.  Once we had sufficiently remove the callus, the ankle was reduced.  A fracture reduction clamp was placed across the fracture.  We then selected our plate from the back table.  This was provisionally fixed on the lateral aspect of the fibula.  Placement, and the length of the plate was confirmed under fluoroscopy.  We then proceeded to place multiple screws through the shaft, to secure the plate to the bone.  We then proceeded to place multiple distal locking screws within the distal cluster, in order to secure the plate.  At this point, there was a little bit of distraction at the oblique fracture.  We did not place a lag screw initially, due to the poor quality of her bone.  However, due to the small amount of distraction, I did place a single screw, orthogonal of the fracture.  This secured the fracture, and reduced this distraction.  We then evaluated the entire ankle very carefully.  Once the lateral plate and screws had been secured, the medial malleolus fracture was perfectly reduced.  As such, I made the decision to proceed with percutaneous fixation of the medial malleolus.  We placed 2 K wires in retrograde fashion, orthogonal to the fracture site, and ensuring that they did not breach the joint.  This was confirmed under fluoroscopy.  We then placed 2 parallel, partially-threaded, cannulated screws across the fracture.  We achieved excellent compression.  These were not within the joint.  We then carefully evaluated the posterior malleolus fracture fragment.  Overall, it was small, approximately 10-15% of the joint line.  The joint was stressed, and there is no syndesmotic displacement.  At this point, we did not feel as though the posterior malleolus fracture fragment require fixation.  Final fluoroscopic imaging, including the mortise view demonstrated good  alignment of the fractures, without further displacement of the syndesmosis, or further widening of the medial clear space.  No further fixation was required.  We irrigated the wound copiously.  We closed the incision in a multilayer fashion with absorbable suture.  Medially, we placed Dermabond, and multiple Steri-Strips.  Sterile dressing was placed, followed by a well-padded splint.  Patient was awoken taken to PACU in stable condition.   Post-operative plan:  The patient will be NWB on the operative extremity Discharge home from the PACU once they have recovered DVT prophylaxis Aspirin 81 mg twice daily for 6 weeks.    Pain control with PRN pain medication preferring oral medicines.   Follow up plan will be scheduled in approximately 14 days for incision check and XR.

## 2022-06-13 NOTE — Discharge Instructions (Signed)
Kim Mann A. Amedeo Kinsman, MD [facility] [address] [place],  [place]  [address] Phone: [phone] Fax: [phone]   [place] Please keep splint clean dry and intact until followup.  You may shower on Post-Op Day #2.  You must keep splint dry during this process and may find that a plastic bag taped around the leg or alternatively a towel based bath may be a better option.   If you get your splint wet or if it is damaged please contact our clinic.  EXERCISES Due to your splint being in place you will not be able to bear weight through your extremity.   DO NOT PUT ANY WEIGHT ON YOUR OPERATIVE LEG Please use crutches or a walker to avoid weight bearing.   REGIONAL ANESTHESIA (NERVE BLOCKS) The anesthesia team may have performed a nerve block for you if safe in the setting of your care.  This is a great tool used to minimize pain.  Typically the block may start wearing off overnight but the long acting medicine may last for 3-4 days.  The nerve block wearing off can be a challenging period but please utilize your as needed pain medications to try and manage this period.    POST-OP MEDICATIONS- Multimodal approach to pain control  In general your pain will be controlled with a combination of substances.  Prescriptions unless otherwise discussed are electronically sent to your pharmacy.  This is a carefully made plan we use to minimize narcotic use.      - Celebrex - Anti-inflammatory medication taken on a scheduled basis  - Acetaminophen - Non-narcotic pain medicine taken on a scheduled basis   - Oxycodone - This is a strong narcotic, to be used only on an "as needed" basis for pain.  -  Aspirin '81mg'$  - This medicine is used to minimize the risk of blood clots after surgery.             -          Zofran - take as needed for nausea   FOLLOW-UP If you develop a Fever (>101.5), Redness or Drainage from the  surgical incision site, please call our office to arrange for an evaluation. Please call the office to schedule a follow-up appointment for your incision check if you do not already have one, 10-14 days post-operatively.  IF YOU HAVE ANY QUESTIONS, PLEASE FEEL FREE TO CALL OUR OFFICE.  HELPFUL INFORMATION  If you had a block, it will wear off between 8-24 hrs postop typically.  This is period when your pain may go from nearly zero to the pain you would have had postop without the block.  This is an abrupt transition but nothing dangerous is happening.  You may take an extra dose of narcotic when this happens.  You should wean off your narcotic medicines as soon as you are able.  Most patients will be off or using minimal narcotics before their first postop appointment.   Elevating your leg will help with swelling and pain control.  You are encouraged to elevate your leg as much as possible in the first couple of weeks following surgery.  Imagine a drop of water on your toe, and your goal is to get that water back to your heart.  We suggest you use the pain medication the first night prior to going to bed, in order to ease any pain when the anesthesia wears off. You  should avoid taking pain medications on an empty stomach as it will make you nauseous.  Do not drink alcoholic beverages or take illicit drugs when taking pain medications.  In most states it is against the law to drive while you are in a splint or sling.  And certainly against the law to drive while taking narcotics.  You may return to work/school in the next couple of days when you feel up to it.   Pain medication may make you constipated.  Below are a few solutions to try in this order: Decrease the amount of pain medication if you aren't having pain. Drink lots of decaffeinated fluids. Drink prune juice and/or each dried prunes  If the first 3 don't work start with additional solutions Take Colace - an over-the-counter stool  softener Take Senokot - an over-the-counter laxative Take Miralax - a stronger over-the-counter laxative

## 2022-06-13 NOTE — Interval H&P Note (Signed)
History and Physical Interval Note:  06/13/2022 7:15 AM  Kim Mann  has presented today for surgery, with the diagnosis of Left trimalleolar ankle fracture.  The various methods of treatment have been discussed with the patient and family. After consideration of risks, benefits and other options for treatment, the patient has consented to  Procedure(s) with comments: OPEN REDUCTION INTERNAL FIXATION (ORIF) ANKLE FRACTURE (Left) - pt knows to arrive at 6:15 as a surgical intervention.  The patient's history has been reviewed, patient examined, no change in status, stable for surgery.  I have reviewed the patient's chart and labs.  Questions were answered to the patient's satisfaction.     [name]

## 2022-06-13 NOTE — Progress Notes (Signed)
Patient requested Chaplain visit after having surgery on her ankle. Chaplain arrived in PACU and found patient with eyes closed but easily awakened. She welcome Chaplain bedside and spoke about her fall, her upbringing in [place], her relationships with her husband, daughter, and 2 sisters. She shared that her sister has cancer and is in Hospice at this time. She was tearful about this and she shared how hard this was for her. Chaplain engaged her in life review, reflective listening, compassionate presence, and prayer bedside today. Patient was very pleased and asked for continued prayer. No further spiritual care needs at this time.   Rev. Bennie Pierini, M.Div Chaplain

## 2022-06-13 NOTE — Anesthesia Preprocedure Evaluation (Addendum)
Anesthesia Evaluation  Patient identified by MRN, date of birth, ID band Patient awake    Reviewed: Allergy & Precautions, H&P , NPO status , Patient's Chart, lab work & pertinent test results  Airway Mallampati: II  TM Distance: >3 FB Neck ROM: Full    Dental  (+) Dental Advisory Given, Caps   Pulmonary former smoker   Pulmonary exam normal breath sounds clear to auscultation       Cardiovascular negative cardio ROS Normal cardiovascular exam Rhythm:Regular Rate:Normal     Neuro/Psych  Neuromuscular disease  negative psych ROS   GI/Hepatic Neg liver ROS,GERD  Medicated and Controlled,,  Endo/Other  negative endocrine ROS    Renal/GU negative Renal ROS  negative genitourinary   Musculoskeletal negative musculoskeletal ROS (+)    Abdominal   Peds negative pediatric ROS (+)  Hematology negative hematology ROS (+)   Anesthesia Other Findings   Reproductive/Obstetrics negative OB ROS                             Anesthesia Physical Anesthesia Plan  ASA: 2  Anesthesia Plan: General   Post-op Pain Management: Dilaudid IV   Induction: Intravenous  PONV Risk Score and Plan: 4 or greater and Ondansetron and Dexamethasone  Airway Management Planned: LMA  Additional Equipment:   Intra-op Plan:   Post-operative Plan: Extubation in OR  Informed Consent: I have reviewed the patients History and Physical, chart, labs and discussed the procedure including the risks, benefits and alternatives for the proposed anesthesia with the patient or authorized representative who has indicated his/her understanding and acceptance.     Dental advisory given  Plan Discussed with: CRNA and Surgeon  Anesthesia Plan Comments:         Anesthesia Quick Evaluation

## 2022-06-13 NOTE — Transfer of Care (Signed)
Immediate Anesthesia Transfer of Care Note  Patient: Kim Mann  Procedure(s) Performed: OPEN REDUCTION INTERNAL FIXATION (ORIF) ANKLE FRACTURE (Left: Ankle)  Patient Location: PACU  Anesthesia Type:General  Level of Consciousness: awake, alert , and oriented  Airway & Oxygen Therapy: Patient Spontanous Breathing  Post-op Assessment: Report given to RN and Post -op Vital signs reviewed and stable  Post vital signs: Reviewed and stable  Last Vitals:  Vitals Value Taken Time  BP 152/69 06/13/22 0915  Temp    Pulse 83 06/13/22 0917  Resp 13 06/13/22 0917  SpO2 97 % 06/13/22 0917  Vitals shown include unvalidated device data.  Last Pain:  Vitals:   06/13/22 0658  TempSrc: Oral  PainSc: 4       Patients Stated Pain Goal: 8 (26/08/88 3584)  Complications: No notable events documented.

## 2022-06-16 ENCOUNTER — Other Ambulatory Visit: Payer: Medicare Other | Admitting: Obstetrics and Gynecology

## 2022-06-16 ENCOUNTER — Encounter (HOSPITAL_COMMUNITY): Payer: Self-pay | Admitting: Orthopedic Surgery

## 2022-06-21 DIAGNOSIS — S82852A Displaced trimalleolar fracture of left lower leg, initial encounter for closed fracture: Secondary | ICD-10-CM | POA: Insufficient documentation

## 2022-06-23 ENCOUNTER — Ambulatory Visit (INDEPENDENT_AMBULATORY_CARE_PROVIDER_SITE_OTHER): Payer: Medicare Other

## 2022-06-23 ENCOUNTER — Ambulatory Visit (HOSPITAL_COMMUNITY): Payer: Medicare Other

## 2022-06-23 ENCOUNTER — Ambulatory Visit (INDEPENDENT_AMBULATORY_CARE_PROVIDER_SITE_OTHER): Payer: Medicare Other | Admitting: Orthopedic Surgery

## 2022-06-23 DIAGNOSIS — S82852D Displaced trimalleolar fracture of left lower leg, subsequent encounter for closed fracture with routine healing: Secondary | ICD-10-CM

## 2022-06-23 MED ORDER — IBUPROFEN 800 MG PO TABS: 800.0000 mg | ORAL_TABLET | Freq: Three times a day (TID) | ORAL | 1 refills | Status: DC | PRN

## 2022-06-23 MED ORDER — OXYCODONE HCL 5 MG PO TABS: 5.0000 mg | ORAL_TABLET | Freq: Three times a day (TID) | ORAL | 0 refills | Status: AC | PRN

## 2022-06-23 NOTE — Progress Notes (Signed)
Postop visit  .dx Encounter Diagnosis  Name Primary?   Closed trimalleolar fracture of left ankle with routine healing, subsequent encounter 06/13/22 Yes   Status post open treatment internal fixation left ankle  Patient of Dr. Larena Glassman  Postop day #10  Wound check.  Wound looks beautiful.  The patient is down to 2 maybe 3 oxycodone per day  Swelling is down.  She says she does hurt a lot.  She is taking Tylenol and oxycodone unguinal add some ibuprofen  New splint applied  Follow-up in 4 weeks to see DR C   X-rays show stable fixation without hardware complications and no loss of reduction  Meds ordered this encounter  Medications   oxyCODONE (ROXICODONE) 5 MG immediate release tablet    Sig: Take 1 tablet (5 mg total) by mouth every 8 (eight) hours as needed for up to 5 days.    Dispense:  15 tablet    Refill:  0   ibuprofen (ADVIL) 800 MG tablet    Sig: Take 1 tablet (800 mg total) by mouth every 8 (eight) hours as needed.    Dispense:  90 tablet    Refill:  1

## 2022-06-24 ENCOUNTER — Other Ambulatory Visit: Payer: Self-pay | Admitting: Orthopedic Surgery

## 2022-06-30 ENCOUNTER — Ambulatory Visit: Payer: Medicare Other | Admitting: Internal Medicine

## 2022-07-04 ENCOUNTER — Encounter: Payer: Self-pay | Admitting: *Deleted

## 2022-07-05 ENCOUNTER — Other Ambulatory Visit: Payer: Self-pay | Admitting: Orthopedic Surgery

## 2022-07-13 ENCOUNTER — Telehealth: Payer: Self-pay | Admitting: Orthopedic Surgery

## 2022-07-13 NOTE — Telephone Encounter (Signed)
Spoke with pt who states she does have swelling when foot is down, with some discoloration. Pt state her toes don't get significantly cold or hot and after she get her foot back elevated the swelling and discoloration goes away. Let pt know that if she gets any painful swelling or the foot gets warm to the touch she should go to the ER, she verbalizes understanding.

## 2022-07-13 NOTE — Telephone Encounter (Signed)
Past few days, foot has been swollen and a purple tint, she is concerned.  She wants to know if this is normal or if she should be concerned.  Pt's # [phone]

## 2022-07-14 ENCOUNTER — Encounter: Payer: Self-pay | Admitting: Radiology

## 2022-07-21 ENCOUNTER — Other Ambulatory Visit: Payer: Medicare Other | Admitting: Obstetrics and Gynecology

## 2022-07-22 ENCOUNTER — Ambulatory Visit (INDEPENDENT_AMBULATORY_CARE_PROVIDER_SITE_OTHER): Payer: Medicare Other

## 2022-07-22 ENCOUNTER — Encounter: Payer: Self-pay | Admitting: Orthopedic Surgery

## 2022-07-22 ENCOUNTER — Ambulatory Visit (INDEPENDENT_AMBULATORY_CARE_PROVIDER_SITE_OTHER): Payer: Medicare Other | Admitting: Orthopedic Surgery

## 2022-07-22 DIAGNOSIS — S82852D Displaced trimalleolar fracture of left lower leg, subsequent encounter for closed fracture with routine healing: Secondary | ICD-10-CM

## 2022-07-22 NOTE — Progress Notes (Signed)
Orthopaedic Postop Note  Assessment: Kim Mann is a 72 y.o. female s/p ORIF of Left ankle fracture  DOS: 06/13/2022  Plan: Sutures removed, steri strips placed Splint removed and she was fitted with a walking boot. WBAT on the operative extremity Simple exercises provided.  Okay to advance her weight bearing.   Radiographs are stable.  All questions were answered.   Follow-up: Return in about 4 weeks (around 08/19/2022). XR at next visit: Left ankle  Subjective:  Chief Complaint  Patient presents with   Routine Post Op    Lt ankle DOS 06/13/22    History of Present Illness: Kim Mann is a 72 y.o. female who presents following the above stated procedure.  Surgery was approximately 6 weeks ago.  She saw Dr. Aline Brochure and her first postop visit.  She has been immobilized in a splint since surgery.  No longer taking pain medications.  No numbness or tingling.  She is hopeful to get out of the splint today.  Review of Systems: No fevers or chills No numbness or tingling No Chest Pain No shortness of breath   Objective: There were no vitals taken for this visit.  Physical Exam:  Alert and oriented.  No acute distress.  Seated in a wheelchair.  Medial and lateral surgical incisions are healing.  No surrounding erythema or drainage.  No tenderness to palpation.  She is able to get to a plantigrade position.  Sensation is intact over the dorsum of her foot.  Toes are warm and well perfused.  IMAGING: I personally ordered and reviewed the following images  X-rays of the left ankle were obtained in clinic today.  These are compared to prior x-rays.  The mortise is congruent.  No syndesmotic disruption.  Prior fracture is not visible in the medial or lateral malleolus.  Posterior malleolus fracture fragment is small, minimally displaced.  There has been interval consolidation.  No evidence of screws backing out.  No hardware failure.  Patient: Healing left trimalleolar  ankle fracture following operative fixation, no hardware failure   [name], MD 07/22/2022 10:21 PM

## 2022-07-22 NOTE — Patient Instructions (Signed)

## 2022-08-02 ENCOUNTER — Telehealth: Payer: Self-pay | Admitting: Orthopedic Surgery

## 2022-08-02 NOTE — Telephone Encounter (Signed)
Dr. Amedeo Kinsman patient - pt called, stated has a few questions, she would like to know if she can soak her foot in a home whirpool and if she should up her Vitamin D 3 for the healing process.  Pt's # [phone]

## 2022-08-03 NOTE — Telephone Encounter (Signed)
Called and left VM and callback information incase of any further questions or concerns.

## 2022-08-19 ENCOUNTER — Telehealth: Payer: Self-pay

## 2022-08-19 ENCOUNTER — Other Ambulatory Visit (INDEPENDENT_AMBULATORY_CARE_PROVIDER_SITE_OTHER): Payer: Medicare Other

## 2022-08-19 ENCOUNTER — Encounter: Payer: Self-pay | Admitting: Orthopedic Surgery

## 2022-08-19 ENCOUNTER — Ambulatory Visit (INDEPENDENT_AMBULATORY_CARE_PROVIDER_SITE_OTHER): Payer: Medicare Other | Admitting: Orthopedic Surgery

## 2022-08-19 DIAGNOSIS — S82852D Displaced trimalleolar fracture of left lower leg, subsequent encounter for closed fracture with routine healing: Secondary | ICD-10-CM

## 2022-08-19 NOTE — Patient Instructions (Signed)
[facility]  [address] [place], [place] [address]  [phone]

## 2022-08-19 NOTE — Telephone Encounter (Signed)
Patient left voicemail stating that she called [facility] to get her therapy scheduled as soon as possible. They told her that they don't have a referral for her from our office. She is asking if we could push this referral through so she can get therapy started.  They told her that they have openings as soon as next week if they can get the referral.

## 2022-08-19 NOTE — Progress Notes (Signed)
Orthopaedic Postop Note  Assessment: Kim Mann is a 72 y.o. female s/p ORIF of Left ankle fracture  DOS: 06/13/2022  Plan: Kim Mann is progressing appropriately.  She notes swelling in the ankle, which improves when she gets off her feet.  She has been progressing her weightbearing, using a walker, but is a little bit concerned about her progression at this point.  She has no swelling on exam.  Radiographs look good.  She is healing.  Hardware remains in stable position.  She has developed some stiffness in the ankle, which is common.  At this point, I am recommending physical therapy.  She can transition out of the walking boot, to a regular shoe.  This will take some time.  She states understanding.  Follow-up in about 1 month  Follow-up: Return in about 4 weeks (around 09/16/2022). XR at next visit: Left ankle  Subjective:  Chief Complaint  Patient presents with   Ankle Injury    Routine Post Op  Lt ankle DOS 06/13/22 Having a lot of pain, swelling, transitioned to tennis shoe this week, but hurts a lot still.  Ambulating with the walker and wearing boot today.       History of Present Illness: Kim Mann is a 72 y.o. female who presents following the above stated procedure.  Surgery was approximately 2 months ago.  She has been using a boot, and has been advancing her weightbearing.  She continues to use a walker.  She has started trying to transition to a regular shoe this week.  Overall, she is doing well, but states some frustration with her lack of progression at this time.  She is not working with physical therapy.  She notes swelling in the ankle, this improves when she gets off her feet.   Review of Systems: No fevers or chills No numbness or tingling No Chest Pain No shortness of breath   Objective: There were no vitals taken for this visit.  Physical Exam:  Alert and oriented.  No acute distress.    Ambulating with the assistance of a walker, in a cam  boot.  Medial and lateral surgical incisions are healing.  No surrounding erythema or drainage.  Not quite able to get to a plantigrade position.  She has no tenderness to palpation medial or lateral.  IMAGING: I personally ordered and reviewed the following images  X-rays of the left ankle were obtained in clinic today.  These are compared to prior x-rays.  There has been interval consolidation of the medial lateral fractures.  Posterior malleolus fracture fragment remains in unchanged position.  Mild step-off is appreciated.  The mortise is congruent.  There is no syndesmotic disruption.  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  Impression: Healed left trimalleolar ankle fracture, with medial and lateral hardware, without failure   Oliver Barre, MD 08/19/2022 10:43 AM

## 2022-08-22 NOTE — Telephone Encounter (Signed)
Faxed to [facility] on 08/19/22

## 2022-09-15 ENCOUNTER — Other Ambulatory Visit (HOSPITAL_COMMUNITY)
Admission: RE | Admit: 2022-09-15 | Discharge: 2022-09-15 | Disposition: A | Discharge status: Home / Self Care | Payer: Medicare Other | Source: Other Acute Inpatient Hospital | Attending: Obstetrics and Gynecology | Admitting: Obstetrics and Gynecology

## 2022-09-15 ENCOUNTER — Encounter: Payer: Self-pay | Admitting: Obstetrics and Gynecology

## 2022-09-15 ENCOUNTER — Ambulatory Visit (INDEPENDENT_AMBULATORY_CARE_PROVIDER_SITE_OTHER): Payer: Medicare Other | Admitting: Obstetrics and Gynecology

## 2022-09-15 VITALS — BP 171/74 | HR 86

## 2022-09-15 DIAGNOSIS — R310 Gross hematuria: Secondary | ICD-10-CM

## 2022-09-15 DIAGNOSIS — R350 Frequency of micturition: Secondary | ICD-10-CM | POA: Diagnosis not present

## 2022-09-15 DIAGNOSIS — N952 Postmenopausal atrophic vaginitis: Secondary | ICD-10-CM

## 2022-09-15 DIAGNOSIS — R82998 Other abnormal findings in urine: Secondary | ICD-10-CM | POA: Insufficient documentation

## 2022-09-15 LAB — POCT URINALYSIS DIPSTICK
Bilirubin, UA: NEGATIVE
Blood, UA: NEGATIVE
Glucose, UA: NEGATIVE
Ketones, UA: NEGATIVE
Nitrite, UA: NEGATIVE
Protein, UA: NEGATIVE
Spec Grav, UA: 1.025 (ref 1.010–1.025)
Urobilinogen, UA: 0.2 E.U./dL
pH, UA: 7 (ref 5.0–8.0)

## 2022-09-15 MED ORDER — LIDOCAINE HCL URETHRAL/MUCOSAL 2 % EX GEL
1.0000 | Freq: Once | CUTANEOUS | Status: AC
  Administered 2022-09-15: 1 via URETHRAL

## 2022-09-15 MED ORDER — ESTRADIOL 0.1 MG/GM VA CREA: TOPICAL_CREAM | VAGINAL | 11 refills | Status: AC

## 2022-09-15 NOTE — Progress Notes (Signed)
[facility] Return Visit  SUBJECTIVE  History of Present Illness: Kim Mann is a 72 y.o. female seen in follow-up for hematuria and bladder urgency.   After last visit, she was supposed to have a cystoscopy and CT A/P for the hematuria but she had an accident where she broke her foot- she needed surgery and it caused her to be wheelchair bound for some time. She is now back to ambulating some.   Has not had any episodes of hematuria since last visit. Last week she was having some bladder urgency that lasted for a short time, but she realized this was after she had some tea and feels like this may have irritated things.   She also reports vaginal dryness that has been worsening. She previously had used estrogen cream but last Rx sent was too expensive.    Surgery hx: s/p posterior repair, perineorrhaphy and sacrospinous ligament fixation on 01/11/21    Past Medical History: Patient  has a past medical history of Chronic constipation, GERD (gastroesophageal reflux disease), History of Helicobacter pylori infection (10/2013), Pre-diabetes, and Prolapse of posterior vaginal wall.   Past Surgical History: She  has a past surgical history that includes Cataract extraction w/ intraocular lens implant (Bilateral, 2017); Strabismus surgery (Right, 02/19/2016); Adjustable suture manipulation (Right, 02/19/2016); Colonoscopy (N/A, 04/04/2017); Colonoscopy with propofol (N/A, 05/25/2020); polypectomy (05/25/2020); Total abdominal hysterectomy w/ bilateral salpingoophorectomy (02/15/1999); Cystocele repair (03/25/2003); Strabismus surgery (Right); Esophagogastroduodenoscopy (10/2013); Rectocele repair (N/A, 01/11/2021); Anterior and posterior repair with sacrospinous fixation (N/A, 01/11/2021); and ORIF ankle fracture (Left, 06/13/2022).   Medications: She has a current medication list which includes the following prescription(s): estradiol, biotin, vitamin d3, ibuprofen, meloxicam,  omeprazole, and polyethylene glycol.   Allergies: Patient is allergic to latex and sulfa antibiotics.   Social History: Patient  reports that she quit smoking about 44 years ago. Her smoking use included cigarettes. She has never used smokeless tobacco. She reports that she does not drink alcohol and does not use drugs.      OBJECTIVE     Physical Exam: Vitals:   09/15/22 0842  BP: (!) 171/74  Pulse: 86   Gen: No apparent distress, A&O x 3.       CYSTOSCOPY A time out was performed.  The periurethral area was prepped and draped in a sterile manner.  2% lidocaine jetpack was inserted at the urethral meatus and the urethra and bladder visualized with a 70-degree scope.  She had normal urethral coaptation and normal urethral mucosa.  She had normal bladder mucosa with trabeculations. She had bilateral clear efflux from both ureteral orifices.  She had no squamous metaplasia at the trigone, cellules or diverticuli.    ASSESSMENT:  Cystoscopy today is normal.   ASSESSMENT AND PLAN    Kim Mann is a 72 y.o. with:  1. Vaginal atrophy   2. Gross hematuria   3. Leukocytes in urine   4. Urinary frequency     - Cystoscopy today is normal. She still needs to complete the CT A/P to finish the hematuria workup. Phone number provided for her to schedule.  - For atrophy, start estrace cream 0.5g nightly for two weeks then twice a week after. We discussed this can also help prevent urinary tract infections.  - Continue avoiding bladder irritants such as tea.   Return 2 months  Marguerita Beards, MD

## 2022-09-15 NOTE — Addendum Note (Signed)
Addended by: Marguerita Beards on: 09/15/2022 11:02 AM   Modules accepted: Orders

## 2022-09-16 ENCOUNTER — Other Ambulatory Visit (INDEPENDENT_AMBULATORY_CARE_PROVIDER_SITE_OTHER): Payer: Medicare Other

## 2022-09-16 ENCOUNTER — Encounter: Payer: Self-pay | Admitting: Orthopedic Surgery

## 2022-09-16 ENCOUNTER — Ambulatory Visit (INDEPENDENT_AMBULATORY_CARE_PROVIDER_SITE_OTHER): Payer: Medicare Other | Admitting: Orthopedic Surgery

## 2022-09-16 DIAGNOSIS — B351 Tinea unguium: Secondary | ICD-10-CM | POA: Diagnosis not present

## 2022-09-16 DIAGNOSIS — Z9889 Other specified postprocedural states: Secondary | ICD-10-CM

## 2022-09-16 DIAGNOSIS — S82852D Displaced trimalleolar fracture of left lower leg, subsequent encounter for closed fracture with routine healing: Secondary | ICD-10-CM | POA: Diagnosis not present

## 2022-09-16 DIAGNOSIS — Z8781 Personal history of (healed) traumatic fracture: Secondary | ICD-10-CM

## 2022-09-16 MED ORDER — CICLOPIROX 8 % EX SOLN: Freq: Every day | CUTANEOUS | 0 refills | Status: DC

## 2022-09-16 MED ORDER — PREDNISONE 10 MG (21) PO TBPK: ORAL_TABLET | ORAL | 0 refills | Status: DC

## 2022-09-16 NOTE — Progress Notes (Signed)
Orthopaedic Postop Note  Assessment: Kim Mann is a 72 y.o. female s/p ORIF of Left ankle fracture  DOS: 06/13/2022  Plan: Kim Mann is doing better.  She is ambulating with some stiffness, but in a regular shoe.  She continues to work well with physical therapy.  Advised her that she can continue to see improvement, encouraged her to keep working on her exercises, as well as walking.  I provided her with some prednisone, she states she is having some left shoulder pain that has persisted following a fall shortly after surgery.  I think this can also help with some of the swelling and pain she continues to have in the left ankle.  I have also provided her with a prescription for a topical treatment for onychomycosis.  I would like to see her back in a month, to reassess the left ankle, as well as discuss the left shoulder in more detail.  Follow-up: Return in about 4 weeks (around 10/14/2022). XR at next visit: Left ankle  Subjective:  Chief Complaint  Patient presents with   Routine Post Op    L ankle DOS 06/13/22    History of Present Illness: Kim Mann is a 72 y.o. female who presents following the above stated procedure.  Surgery was approximately 3 months ago.  She has been working with physical therapy.  She is no longer using an assistive device.  She is walking in a regular shoe.  She notes pain on regular basis, diffusely around the foot and the ankle.  No recent issues.  She reports that she fell, in the first few days following surgery.  Since then, she has had pain in the tailbone, as well as the left shoulder.  The left shoulder continues to bother her.  She remains reluctant to take any medications.  She has been icing on a regular basis.  Review of Systems: No fevers or chills No numbness or tingling No Chest Pain No shortness of breath   Objective: There were no vitals taken for this visit.  Physical Exam:  Alert and oriented.  No acute distress.     Left-sided antalgic gait.  No assistive device.  Medial lateral surgical incisions of healed.  No surrounding erythema or drainage.  She is able to get to a plantigrade position.  Diffuse tenderness to palpation around the foot and the ankle.  Toes are warm and well-perfused.  Sensation is intact over the dorsum of the foot.   IMAGING: I personally ordered and reviewed the following images  X-rays left ankle were obtained in clinic today.  These are compared to prior x-rays.  Prior fractures are not visible.  Hardware remains intact.  No evidence of hardware failure.  Screws are not backing out.  Mild loss of joint space within the tibiotalar joint.  No new injuries.  No bony lesions.  Impression: Healed left trimalleolar ankle fracture without hardware failure.   Oliver Barre, MD 09/16/2022 10:30 AM

## 2022-09-18 LAB — URINE CULTURE: Culture: 20000 — AB

## 2022-09-19 ENCOUNTER — Other Ambulatory Visit: Payer: Self-pay | Admitting: Obstetrics and Gynecology

## 2022-09-19 MED ORDER — NITROFURANTOIN MONOHYD MACRO 100 MG PO CAPS: 100.0000 mg | ORAL_CAPSULE | Freq: Two times a day (BID) | ORAL | 0 refills | Status: AC

## 2022-09-19 NOTE — Progress Notes (Signed)
Urine culture positive for Enterococcus, low colonies but as patient did recently have a cystoscopy will treat. Macrobid sent to pharmacy for patient.

## 2022-09-19 NOTE — Progress Notes (Signed)
Pt has been contacted and notified of the message below

## 2022-10-14 ENCOUNTER — Other Ambulatory Visit (INDEPENDENT_AMBULATORY_CARE_PROVIDER_SITE_OTHER): Payer: Medicare Other

## 2022-10-14 ENCOUNTER — Ambulatory Visit (INDEPENDENT_AMBULATORY_CARE_PROVIDER_SITE_OTHER): Payer: Medicare Other | Admitting: Orthopedic Surgery

## 2022-10-14 ENCOUNTER — Encounter: Payer: Self-pay | Admitting: Orthopedic Surgery

## 2022-10-14 DIAGNOSIS — Z8781 Personal history of (healed) traumatic fracture: Secondary | ICD-10-CM

## 2022-10-14 DIAGNOSIS — M25572 Pain in left ankle and joints of left foot: Secondary | ICD-10-CM | POA: Diagnosis not present

## 2022-10-14 DIAGNOSIS — M25512 Pain in left shoulder: Secondary | ICD-10-CM

## 2022-10-14 DIAGNOSIS — Z9889 Other specified postprocedural states: Secondary | ICD-10-CM

## 2022-10-14 DIAGNOSIS — G8929 Other chronic pain: Secondary | ICD-10-CM | POA: Diagnosis not present

## 2022-10-14 NOTE — Progress Notes (Signed)
Orthopaedic Postop Note  Assessment: Kim Mann is a 72 y.o. female s/p ORIF of Left ankle fracture; left shoulder pain.  DOS: 06/13/2022  Plan: Mrs. Azbell continues to improve.  She is working well with physical therapy.  She does continue to have pain, but is working through.  She needs to focus on stretching of the heel cord, which should improve her ability to ambulate.  Radiographs are stable.  Minimal swelling around the ankle.  Regarding her left shoulder, radiographs are negative.  She has good range of motion and good strength.  This most likely represents an tendinitis.  We briefly discussed an injection, but she is not interested.  I provided her with basic shoulder exercises to initiate.  Follow-up: Return in about 3 months (around 01/14/2023). XR at next visit: Left ankle  Subjective:  Chief Complaint  Patient presents with   Post-op Problem    L ankle DOS 06/13/22    History of Present Illness: Kim Mann is a 72 y.o. female who presents following the above stated procedure.  Surgery was approximately 4 months ago.  She is working diligently with physical therapy.  She is walking over 1/4 mile daily.  She does continue to have pain laterally, as well as over the anterior ankle, but this is improving.  She feels more comfortable.  She continues to have left shoulder pain.  Pain is in the front and the back of the left shoulder.  She is not taking medications.  She will use ice.   Review of Systems: No fevers or chills No numbness or tingling No Chest Pain No shortness of breath   Objective: There were no vitals taken for this visit.  Physical Exam:  Alert and oriented.  No acute distress.    Mild left-sided antalgic gait.  No assistive device.  Left ankle surgical incisions are healed.  No surrounding erythema or drainage.  She is able to get to a plantigrade position, but only a few degrees beyond plantigrade.  Minimal swelling in the ankle.  Toes warm  and well-perfused.  She tolerates inversion and eversion of the ankle.  Left shoulder without deformity.  No swelling.  Tenderness over the anterior posterior shoulder.  She has near full range of motion.  She has excellent strength in the left shoulder.  IMAGING: I personally ordered and reviewed the following images  X-rays of the left ankle were obtained in clinic today.  These are compared to available x-rays.  There has been no interval displacement.  Fractures have healed.  Hardware remains in good position.  No screws are backing out.  No broken hardware.  Mortise is congruent, with some loss of joint space.  No syndesmotic disruption.  Impression: Healed left trimalleolar ankle fracture without hardware failure.   X-rays of the left shoulder were obtained in clinic today.  No acute injuries are noted.  Glenohumeral joint is well-maintained joint space.  No evidence of proximal humeral migration.  Mild degenerative changes noted at the [facility] joint.  No bony lesions.  Impression: Negative left shoulder x-ray   Oliver Barre, MD 10/14/2022 10:45 AM

## 2022-10-14 NOTE — Patient Instructions (Signed)

## 2022-11-09 ENCOUNTER — Ambulatory Visit (HOSPITAL_COMMUNITY)
Admission: RE | Admit: 2022-11-09 | Discharge: 2022-11-09 | Disposition: A | Discharge status: Home / Self Care | Payer: Medicare Other | Source: Ambulatory Visit | Attending: Obstetrics and Gynecology | Admitting: Obstetrics and Gynecology

## 2022-11-09 DIAGNOSIS — R310 Gross hematuria: Secondary | ICD-10-CM | POA: Diagnosis present

## 2022-11-09 LAB — POCT I-STAT CREATININE: Creatinine, Ser: 0.6 mg/dL (ref 0.44–1.00)

## 2022-11-09 MED ORDER — IOHEXOL 300 MG/ML  SOLN
100.0000 mL | Freq: Once | INTRAMUSCULAR | Status: AC | PRN
  Administered 2022-11-09: 100 mL via INTRAVENOUS

## 2022-11-14 NOTE — Progress Notes (Unsigned)
[facility] Return Visit  SUBJECTIVE  History of Present Illness: COREEN PETRIELLO is a 72 y.o. female seen in follow-up for Hematuria. Plan at last visit was Obtain CT scan after cystoscopy.   Has been limiting her tea. She reports tea is a huge trigger.   She has been using astroglide and estrogen cream.   Past Medical History: Patient  has a past medical history of Chronic constipation, GERD (gastroesophageal reflux disease), History of Helicobacter pylori infection (10/2013), Pre-diabetes, and Prolapse of posterior vaginal wall.   Past Surgical History: She  has a past surgical history that includes Cataract extraction w/ intraocular lens implant (Bilateral, 2017); Strabismus surgery (Right, 02/19/2016); Adjustable suture manipulation (Right, 02/19/2016); Colonoscopy (N/A, 04/04/2017); Colonoscopy with propofol (N/A, 05/25/2020); polypectomy (05/25/2020); Total abdominal hysterectomy w/ bilateral salpingoophorectomy (02/15/1999); Cystocele repair (03/25/2003); Strabismus surgery (Right); Esophagogastroduodenoscopy (10/2013); Rectocele repair (N/A, 01/11/2021); Anterior and posterior repair with sacrospinous fixation (N/A, 01/11/2021); and ORIF ankle fracture (Left, 06/13/2022).   Medications: She has a current medication list which includes the following prescription(s): biotin, vitamin d3, ciclopirox, estradiol, ibuprofen, meloxicam, omeprazole, polyethylene glycol, and tretinoin.   Allergies: Patient is allergic to latex and sulfa antibiotics.   Social History: Patient  reports that she quit smoking about 44 years ago. Her smoking use included cigarettes. She has never used smokeless tobacco. She reports that she does not drink alcohol and does not use drugs.      OBJECTIVE    IMPRESSION: 1. Nonobstructing left nephrolithiasis. No hydronephrosis. No ureteral or bladder stones. 2. No renal cortical masses. No evidence of urothelial lesions. 3. Probable tiny  cystocele. 4. Mild diffuse hepatic steatosis. 5. Sliding small hiatal hernia. 6. Moderate sigmoid diverticulosis. 7. Coronary atherosclerosis. 8.  Aortic Atherosclerosis (ICD10-I70.0).  Physical Exam: Vitals:   11/15/22 1048 11/15/22 1053  BP: (!) 164/80 134/74  Pulse: 69 69   Gen: No apparent distress, A&O x 3.  Detailed Urogynecologic Evaluation:  Deferred.    ASSESSMENT AND PLAN    Ms. Lambright is a 72 y.o. with:  1. Nephrolithiasis   2. Gross hematuria   3. Vaginal atrophy    Patient has two 2mm stones in the left kidney without hydronephrosis. We discussed this could be the source of the hematuria in the absence of infection.  She is aware of the stones and aware that if she has concerns we will refer her to urology. At this time she reports she is asymptomatic. She has been using her estrogen cream x1 weekly and astroglide.   Patient to follow up PRN. She reports she will call if she has issues.

## 2022-11-15 ENCOUNTER — Ambulatory Visit (INDEPENDENT_AMBULATORY_CARE_PROVIDER_SITE_OTHER): Payer: Medicare Other | Admitting: Obstetrics and Gynecology

## 2022-11-15 ENCOUNTER — Encounter: Payer: Self-pay | Admitting: Obstetrics and Gynecology

## 2022-11-15 VITALS — BP 134/74 | HR 69

## 2022-11-15 DIAGNOSIS — N200 Calculus of kidney: Secondary | ICD-10-CM

## 2022-11-15 DIAGNOSIS — R310 Gross hematuria: Secondary | ICD-10-CM

## 2022-11-15 DIAGNOSIS — N952 Postmenopausal atrophic vaginitis: Secondary | ICD-10-CM

## 2023-01-11 ENCOUNTER — Other Ambulatory Visit (INDEPENDENT_AMBULATORY_CARE_PROVIDER_SITE_OTHER): Payer: Medicare Other

## 2023-01-11 ENCOUNTER — Encounter: Payer: Self-pay | Admitting: Orthopedic Surgery

## 2023-01-11 ENCOUNTER — Ambulatory Visit (INDEPENDENT_AMBULATORY_CARE_PROVIDER_SITE_OTHER): Payer: Medicare Other | Admitting: Orthopedic Surgery

## 2023-01-11 VITALS — BP 140/76 | HR 74 | Ht 66.5 in | Wt 156.0 lb

## 2023-01-11 DIAGNOSIS — Z9889 Other specified postprocedural states: Secondary | ICD-10-CM

## 2023-01-11 DIAGNOSIS — Z8781 Personal history of (healed) traumatic fracture: Secondary | ICD-10-CM

## 2023-01-11 DIAGNOSIS — M25572 Pain in left ankle and joints of left foot: Secondary | ICD-10-CM

## 2023-01-11 MED ORDER — GABAPENTIN 100 MG PO CAPS
100.0000 mg | ORAL_CAPSULE | Freq: Three times a day (TID) | ORAL | 0 refills | Status: DC
Start: 2023-01-11 — End: 2023-05-04

## 2023-01-11 NOTE — Progress Notes (Signed)
Orthopaedic Postop Note  Assessment: Kim Mann is a 72 y.o. female s/p ORIF of Left ankle fracture DOS: 06/13/2022  Plan: Kim Mann has not been doing as well recently.  She continues to have pain in the left ankle, as well as the left foot.  She has tenderness over the hardware medially.  She also describes burning sensations throughout the left foot.  Her activity is not increased at the rate that she would like.  I stressed the importance of continuing to remain active, but understanding the limitations following surgery.  She also has a tight heel cord.  She has not been taking any medications.  I provided her with a prescription for gabapentin to help with the burning pain.  I have also recommended that she take naproxen more frequently, if she continues to have pain.  We can consider removal of the hardware.  If we do this, I may consider a gastroc recession as well.  I would like see her back in 3 months, unless she has issues.  She states understanding.  Follow-up: Return in about 3 months (around 04/13/2023). XR at next visit: Left ankle  Subjective:  Chief Complaint  Patient presents with   Routine Post Op    L ankle DOS 06/13/22    History of Present Illness: Kim Mann is a 72 y.o. female who presents following the above stated procedure.  Surgery was approximately 7 months ago.  She has stopped working with physical therapy.  She has been doing exercises on her own.  She is able to walk about a mile every other day, and do exercises for her ankle on intervening days.  However, she continues to have pain.  It is restricting her activity.  She is unable to continue to progress.  She has some burning type sensations around the left foot, including over the dorsum and the medial aspect of her foot.  She does have some tenderness medially around the hardware.  She continues to have restriction of her heel cord.  She takes naproxen when needed, which she states is about once  per week.  This helps with her symptoms, but she is reluctant to take it more frequently.   Review of Systems: No fevers or chills No numbness or tingling No Chest Pain No shortness of breath   Objective: BP (!) 140/76   Pulse 74   Ht 5' 6.5" (1.689 m)   Wt 156 lb (70.8 kg)   BMI 24.80 kg/m   Physical Exam:  Alert and oriented.  No acute distress.    left-sided antalgic gait.  No assistive device.  Left ankle surgical incisions are healed.  No surrounding erythema or drainage.  She is able to get to a plantigrade position, but only a few degrees beyond plantigrade.  Toes warm and well-perfused.  She tolerates inversion and eversion of the ankle.  Tenderness over the surgical incisions medially.  No erythema.  No drainage.  No tenderness within the tibiotalar joint.   IMAGING: I personally ordered and reviewed the following images  X-rays of the left ankle were obtained in clinic today.  These are compared to prior x-rays.  No interval displacement.  Fractures remain in good alignment.  Hardware remains intact.  No evidence of screws backing out.  No lucency around the screws or the plate.  Mortise is congruent.  Decreased joint space within the tibiotalar joint.  No osteophytes.  Impression: Healed left trimalleolar ankle fracture without hardware failure following surgery  Oliver Barre, MD 01/11/2023 2:56 PM

## 2023-04-12 ENCOUNTER — Ambulatory Visit: Payer: Medicare Other | Admitting: Orthopedic Surgery

## 2023-04-12 ENCOUNTER — Other Ambulatory Visit (INDEPENDENT_AMBULATORY_CARE_PROVIDER_SITE_OTHER): Payer: Medicare Other

## 2023-04-12 DIAGNOSIS — M6702 Short Achilles tendon (acquired), left ankle: Secondary | ICD-10-CM

## 2023-04-12 DIAGNOSIS — T8484XA Pain due to internal orthopedic prosthetic devices, implants and grafts, initial encounter: Secondary | ICD-10-CM | POA: Diagnosis not present

## 2023-04-12 DIAGNOSIS — S82852D Displaced trimalleolar fracture of left lower leg, subsequent encounter for closed fracture with routine healing: Secondary | ICD-10-CM

## 2023-04-12 DIAGNOSIS — N200 Calculus of kidney: Secondary | ICD-10-CM | POA: Insufficient documentation

## 2023-04-12 DIAGNOSIS — R310 Gross hematuria: Secondary | ICD-10-CM | POA: Insufficient documentation

## 2023-04-12 NOTE — Patient Instructions (Signed)
Your surgery will be at [facility], scheduled with Dr Thane Edu   The hospital will contact you with a preoperative appointment to discuss Anesthesia. The phone number is [phone]   Please bring your medications with you for the appointment.  They will tell you the arrival time and medication instructions when you have your preoperative evaluation.  Do not wear nail polish the day of your surgery and if you take Phentermine you need to stop this medication ONE WEEK prior to your surgery.    If you take an blood thinning medication, we will need to stop this prior to surgery.  Typically, we stop this medicine at least 5 days prior to surgery.  We will need to confirm this with the doctor who prescribes this medication.  If you are taking medications or an injection for diabetes, or for weight management, this medicine will need to be stopped at least 7 days prior to surgery.     Surgery will be scheduled for 06/14/2022 pending authorization by your insurance company.

## 2023-04-13 ENCOUNTER — Encounter: Payer: Self-pay | Admitting: Orthopedic Surgery

## 2023-04-13 NOTE — Progress Notes (Signed)
Orthopaedic Postop Note  Assessment: Kim Mann is a 72 y.o. female s/p ORIF of Left ankle fracture DOS: 06/13/2022  Plan: [name] continues to have pain in the left ankle.  She does have occasional swelling.  Pain is diffuse.  She is difficulty ambulating.  She has not been able to return to her previous level of function.  Radiographs remained stable.  I do think she has healed her fracture.  It is possible that the hardware is contributing to some of her ongoing pain.  In addition, she did not regain dorsiflexion following surgery.  We have discussed all of this multiple times in clinic.  At this point, she is interested to having the hardware removed.  I advised her that if we plan to remove all hardware, I would also plan to proceed with a gastroc recession.  This procedure was discussed.  This would give her more dorsiflexion.  I believe this would allow her to return to her previous level of function.  She states understanding.  She is in agreement with this plan.  She will return to nonweightbearing status, in a boot for at least a couple weeks following surgery.  She will be consented for hardware removal from the left ankle, as well as left gastrocnemius recession.  This will be done endoscopically.  Risks and benefits of the surgery, including, but not limited to infection, bleeding, persistent pain, stiffness, blood clots, need for further surgery, non-union, malunion and more severe complications associated with anesthesia were discussed with the patient.  The patient has elected to proceed.    Follow-up: Return for After surgery; DOS 05/15/2023. XR at next visit: Left ankle  Subjective:  Chief Complaint  Patient presents with   Follow-up    Recheck on left ankle, DOS 06-13-22.    History of Present Illness: Kim Mann is a 72 y.o. female who presents following the above stated procedure.  Surgery was approximately 10 months ago.  She remains frustrated with her lack  of progression.  She states that she is in pain all the time.  She describes radiating pains in both the medial and lateral aspect of the ankle.  She describes a tightness over the anterior aspect of the ankle.  She has not been taking NSAIDs.  She states the gabapentin helped, but she was only taking this as needed.  She notes occasional swelling.  She has not been able to return to her previous level of function.   Review of Systems: No fevers or chills No numbness or tingling No Chest Pain No shortness of breath   Objective: There were no vitals taken for this visit.  Physical Exam:  Alert and oriented.  No acute distress.    left-sided antalgic gait.  Left foot external rotation  Medial lateral surgical incisions are healed.  No surrounding erythema or drainage.  No specific point tenderness.  Generalized tenderness over the lateral ankle.  She does have some tenderness over the medial screws.  Just a few degrees of dorsiflexion, with her knee bent.  No tenderness within the Achilles.  Toes are warm and well-perfused.  Sensation intact to the dorsum of the foot.  IMAGING: I personally ordered and reviewed the following images  X-ray of the left ankle were obtained in clinic today.  These are compared to prior x-rays.  Hardware remains intact.  Screws of both the medial and lateral malleoli remain in good position.  Screws not backing out.  Fracture lines are not visible.  Posterior malleolus fracture fragment remains in good position.  Mild loss of joint space within the tibiotalar joint.  No bony lesions.  Impression: Healed left trimalleolar ankle fracture without hardware failure   Oliver Barre, MD 04/13/2023 1:09 PM

## 2023-05-02 ENCOUNTER — Encounter (INDEPENDENT_AMBULATORY_CARE_PROVIDER_SITE_OTHER): Payer: Self-pay | Admitting: *Deleted

## 2023-05-04 ENCOUNTER — Ambulatory Visit (INDEPENDENT_AMBULATORY_CARE_PROVIDER_SITE_OTHER): Payer: Medicare Other | Admitting: Internal Medicine

## 2023-05-04 ENCOUNTER — Encounter: Payer: Self-pay | Admitting: Internal Medicine

## 2023-05-04 VITALS — BP 139/69 | HR 69 | Temp 97.6°F | Ht 67.0 in | Wt 156.6 lb

## 2023-05-04 DIAGNOSIS — K219 Gastro-esophageal reflux disease without esophagitis: Secondary | ICD-10-CM

## 2023-05-04 DIAGNOSIS — R1310 Dysphagia, unspecified: Secondary | ICD-10-CM | POA: Diagnosis not present

## 2023-05-04 DIAGNOSIS — Z860101 Personal history of adenomatous and serrated colon polyps: Secondary | ICD-10-CM | POA: Diagnosis not present

## 2023-05-04 DIAGNOSIS — K5904 Chronic idiopathic constipation: Secondary | ICD-10-CM

## 2023-05-04 DIAGNOSIS — Z800 Family history of malignant neoplasm of digestive organs: Secondary | ICD-10-CM

## 2023-05-04 DIAGNOSIS — D126 Benign neoplasm of colon, unspecified: Secondary | ICD-10-CM

## 2023-05-04 DIAGNOSIS — R1319 Other dysphagia: Secondary | ICD-10-CM

## 2023-05-04 MED ORDER — PANTOPRAZOLE SODIUM 40 MG PO TBEC: 40.0000 mg | DELAYED_RELEASE_TABLET | Freq: Two times a day (BID) | ORAL | 11 refills | Status: DC

## 2023-05-04 NOTE — Patient Instructions (Signed)
We will schedule you for upper endoscopy to further evaluate your reflux.  I will change your omeprazole to pantoprazole 40 mg twice daily.  This medication works best if you take at least 30 minutes for breakfast.  The second dose you can take 30 minutes for dinner or at bedtime.  At the same time as upper endoscopy, we will perform colonoscopy given your history of polyps as well as family history of colon cancer.  Continue on MiraLAX daily.  It was very nice seeing you today.  I hope you have a fantastic Christmas.  Dr. Marletta Lor

## 2023-05-04 NOTE — Progress Notes (Signed)
Referring Provider: Alvina Filbert, MD Primary Care Physician:  Alvina Filbert, MD Primary GI:  Dr. Marletta Lor  Chief Complaint  Patient presents with   New Patient (Initial Visit)    Follow up before colonoscopy    HPI:   Kim Mann is a 72 y.o. female who presents to clinic today for follow-up visit.  Chronic GERD: Currently taking omeprazole 20 mg twice daily, having breakthrough symptoms.  Does note occasional esophageal dysphagia which she describes as a choking sensation.  No melena hematochezia.  No chronic NSAID use.  Chronic constipation: Mild, taking MiraLAX daily.  Well-controlled.  History of vaginal prolapse after hysterectomy status post posterior repair, perineorrhaphy, sacrospinous ligament fixation 01/11/21  Family history of colon cancer, personal history adenomatous colon polyps: Patient reports both brother and sister have been diagnosed with colon cancer.  Other sister has history of stage IV lymphoma currently in remission, found on upper endoscopy.  Colonoscopy January 2022 with 2 polyps removed, 10 mm sessile serrated polyp, 1 small hyperplastic polyp.  Due for surveillance now.   Past Medical History:  Diagnosis Date   Chronic constipation    GERD (gastroesophageal reflux disease)    History of Helicobacter pylori infection 10/2013   per EGD w/ bx,  treated   Pre-diabetes    Prolapse of posterior vaginal wall     Past Surgical History:  Procedure Laterality Date   ADJUSTABLE SUTURE MANIPULATION Right 02/19/2016   Procedure: ADJUSTABLE SUTURE MANIPULATION;  Surgeon: Verne Carrow, MD;  Location: [facility];  Service: Ophthalmology;  Laterality: Right;   ANTERIOR AND POSTERIOR REPAIR WITH SACROSPINOUS FIXATION N/A 01/11/2021   Procedure: SACROSPINOUS LIGAMENT FIXATION;  Surgeon: Marguerita Beards, MD;  Location: [facility];  Service: Gynecology;  Laterality: N/A;   CATARACT EXTRACTION W/ INTRAOCULAR LENS IMPLANT  Bilateral 2017   COLONOSCOPY N/A 04/04/2017   Procedure: COLONOSCOPY;  Surgeon: West Bali, MD;  Location: [facility];  Service: Endoscopy;  Laterality: N/A;  12:45pm - LM for pt to arrive at 12:15   COLONOSCOPY WITH PROPOFOL N/A 05/25/2020   Internal hemorrhoids, many small and large-mouthed diverticula in sigmoid, descending, and transverse colon. 3 mm polyp in sigmoid. 10 mm polyp in transverse. 3 year surveillance. Sessile serrated polyps.    CYSTOCELE REPAIR  03/25/2003   @[facility] ;  Anterior Colporrhaphy   ESOPHAGOGASTRODUODENOSCOPY  10/2013   ORIF ANKLE FRACTURE Left 06/13/2022   Procedure: OPEN REDUCTION INTERNAL FIXATION (ORIF) ANKLE FRACTURE;  Surgeon: Oliver Barre, MD;  Location: [facility];  Service: Orthopedics;  Laterality: Left;   POLYPECTOMY  05/25/2020   Procedure: POLYPECTOMY;  Surgeon: Lanelle Bal, DO;  Location: [facility];  Service: Endoscopy;;   RECTOCELE REPAIR N/A 01/11/2021   Procedure: POSTERIOR REPAIR (RECTOCELE) with perineorrhaphy;  Surgeon: Marguerita Beards, MD;  Location: [facility];  Service: Gynecology;  Laterality: N/A;   STRABISMUS SURGERY Right 02/19/2016   Procedure: REPAIR STRABISMUS;  Surgeon: Verne Carrow, MD;  Location: [facility];  Service: Ophthalmology;  Laterality: Right;   STRABISMUS SURGERY Right    age 10 and 22   TOTAL ABDOMINAL HYSTERECTOMY W/ BILATERAL SALPINGOOPHORECTOMY  02/15/1999   @[facility]  (operative record in care everywhere)  ;  Culdoplasty /  Burch Cystourethropexy/  Posterior Colporrhaphy    Current Outpatient Medications  Medication Sig Dispense Refill   Biotin 1000 MCG tablet Take 1,000 mcg by mouth daily.     Cholecalciferol (VITAMIN D3) 50 MCG (2000 UT) TABS  Take 2,000 Units by mouth daily.     estradiol (ESTRACE) 0.1 MG/GM vaginal cream Place 0.5g nightly for two weeks then twice a week after (Patient taking differently: Place 1 Applicatorful vaginally once a week.) 30 g 11    pantoprazole (PROTONIX) 40 MG tablet Take 1 tablet (40 mg total) by mouth 2 (two) times daily. 60 tablet 11   Polyethyl Glycol-Propyl Glycol (SYSTANE) 0.4-0.3 % SOLN Place 1 drop into both eyes daily as needed (Dry eye).     polyethylene glycol (MIRALAX / GLYCOLAX) 17 g packet Take 17 g by mouth every other day.     tretinoin (RETIN-A) 0.01 % gel Apply 1 application  topically every other day. At bedtime     No current facility-administered medications for this visit.    Allergies as of 05/04/2023 - Review Complete 05/04/2023  Allergen Reaction Noted   Latex Rash 04/29/2020   Sulfa antibiotics Rash 02/15/2016    Family History  Problem Relation Age of Onset   Brain cancer Mother 3   Heart failure Father 41   Colon cancer Sister 26   Colon polyps Neg Hx     Social History   Socioeconomic History   Marital status: Married    Spouse name: Not on file   Number of children: Not on file   Years of education: Not on file   Highest education level: Not on file  Occupational History   Not on file  Tobacco Use   Smoking status: Former    Current packs/day: 0.00    Types: Cigarettes    Start date: 89    Quit date: 1980    Years since quitting: 44.9   Smokeless tobacco: Never  Vaping Use   Vaping status: Never Used  Substance and Sexual Activity   Alcohol use: No   Drug use: No   Sexual activity: Not Currently    Birth control/protection: Surgical  Other Topics Concern   Not on file  Social History Narrative   2 KIDS: AGE 101 AND 35. 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   HAS SIX SIBLINGS(3 SIS, 2 BRO)   Social Drivers of Corporate investment banker Strain: Not on file  Food Insecurity: Not on file  Transportation Needs: Not on file  Physical Activity: Not on file  Stress: Not on file  Social Connections: Unknown (09/27/2021)   Received from [facility]   Social Network    Social Network: Not  on file    Subjective: Review of Systems  Constitutional:  Negative for chills and fever.  HENT:  Negative for congestion and hearing loss.   Eyes:  Negative for blurred vision and double vision.  Respiratory:  Negative for cough and shortness of breath.   Cardiovascular:  Negative for chest pain and palpitations.  Gastrointestinal:  Negative for abdominal pain, blood in stool, constipation, diarrhea, heartburn, melena and vomiting.  Genitourinary:  Negative for dysuria and urgency.  Musculoskeletal:  Negative for joint pain and myalgias.  Skin:  Negative for itching and rash.  Neurological:  Negative for dizziness and headaches.  Psychiatric/Behavioral:  Negative for depression. The patient is not nervous/anxious.      Objective: BP 139/69   Pulse 69   Temp 97.6 F (36.4 C)   Ht 5\' 7"  (1.702 m)   Wt 156 lb 9.6 oz (71 kg)   BMI 24.53 kg/m  Physical Exam Constitutional:      Appearance:  Normal appearance.  HENT:     Head: Normocephalic and atraumatic.  Eyes:     Extraocular Movements: Extraocular movements intact.     Conjunctiva/sclera: Conjunctivae normal.  Cardiovascular:     Rate and Rhythm: Normal rate and regular rhythm.  Pulmonary:     Effort: Pulmonary effort is normal.     Breath sounds: Normal breath sounds.  Abdominal:     General: Bowel sounds are normal.     Palpations: Abdomen is soft.  Musculoskeletal:        General: No swelling. Normal range of motion.     Cervical back: Normal range of motion and neck supple.  Skin:    General: Skin is warm and dry.     Coloration: Skin is not jaundiced.  Neurological:     General: No focal deficit present.     Mental Status: She is alert and oriented to person, place, and time.  Psychiatric:        Mood and Affect: Mood normal.        Behavior: Behavior normal.      Assessment/Plan:  1.  Chronic GERD, esophageal dysphagia- Will schedule for EGD with possible dilation to evaluate for peptic ulcer disease,  esophagitis, gastritis, H. Pylori, duodenitis, or other. Will also evaluate for esophageal stricture, Schatzki's ring, esophageal web or other.   Will change omeprazole to pantoprazole 40 mg BID. Limit NSAID use.  2.  Family history of colon cancer in brother and sister, personal history of sessile serrated colon polyps-at same time as upper endoscopy will perform colonoscopy.  The risks including infection, bleed, or perforation as well as benefits, limitations, alternatives and imponderables have been reviewed with the patient. Potential for esophageal dilation, biopsy, etc. have also been reviewed.  Questions have been answered. All parties agreeable.  3.  Chronic idiopathic constipation-well-controlled MiraLAX daily.  We will continue  05/04/2023 9:06 AM   Disclaimer: This note was dictated with voice recognition software. Similar sounding words can inadvertently be transcribed and may not be corrected upon review.

## 2023-05-08 NOTE — Patient Instructions (Signed)
KRISTN APPELMAN  05/08/2023     @PREFPERIOPPHARMACY @   Your procedure is scheduled on  05/15/2023.   Report to [facility] at 0600  A.M.   Call this number if you have problems the morning of surgery:  [phone]  If you experience any cold or flu symptoms such as cough, fever, chills, shortness of breath, etc. between now and your scheduled surgery, please notify us at the above number.   Remember:  Do not eat after midnight.    You may drink clear liquids until 0330 am on 05/15/2023.     Clear liquids allowed are:                    Water, Juice (No red color; non-citric and without pulp; diabetics please choose diet or no sugar options), Carbonated beverages (diabetics please choose diet or no sugar options), Clear Tea (No creamer, milk, or cream, including half & half and powdered creamer), Plain Jell-O Only (No red color; diabetics please choose no sugar options), and Clear Sports drink (No red color; diabetics please choose diet or no sugar options)       At 0330 am on 05/15/2023 drink your carb drink. You can have nothing else to drink after this.   Take these medicines the morning of surgery with A SIP OF WATER                                             pantoprazole.    Do not wear jewelry, make-up or nail polish, including gel polish,  artificial nails, or any other type of covering on natural nails (fingers and  toes).  Do not wear lotions, powders, or perfumes, or deodorant.  Do not shave 48 hours prior to surgery.  Men may shave face and neck.  Do not bring valuables to the hospital.  [facility] is not responsible for any belongings or valuables.  Contacts, dentures or bridgework may not be worn into surgery.  Leave your suitcase in the car.  After surgery it may be brought to your room.  For patients admitted to the hospital, discharge time will be determined by your treatment team.  Patients discharged the day of surgery will not be allowed  to drive home and must have someone with them for 24 hours.    Special instructions:   DO NOT smoke tobacco or vape for 24 hours before your procedure.  Please read over the following fact sheets that you were given. Anesthesia Post-op Instructions and Care and Recovery After Surgery      Orthopedic Hardware Removal, Care After The following information offers guidance on how to care for yourself after your procedure. Your health care provider may also give you more specific instructions. If you have problems or questions, contact your health care provider. What can I expect after the procedure? After the procedure, it is common to have: Soreness or pain. Some redness and swelling in the area where the hardware was removed. A small amount of blood or fluid coming from your incision. Follow these instructions at home: Medicines Take over-the-counter and prescription medicines only as told by your health care provider. Ask your health care provider if the medicine prescribed to you: Requires you to avoid driving or using machinery. Can cause constipation. You may need to take these  actions to prevent or treat constipation: Drink enough fluid to keep your urine pale yellow. Take over-the-counter or prescription medicines. Eat foods that are high in fiber, such as beans, whole grains, and fresh fruits and vegetables. Limit foods that are high in fat and processed sugars, such as fried or sweet foods. If you have a nonremovable cast: Do not put pressure on any part of the cast until it is fully hardened. This may take several hours. Do not stick anything inside the cast to scratch your skin. Doing that increases your risk of infection. Check the skin around the cast every day. Tell your health care provider about any concerns. You may put lotion on dry skin around the edges of the cast. Do not put lotion on the skin underneath the cast. Keep the cast clean and dry. If you have a removable  splint or boot: Wear the splint or boot as told by your health care provider. Remove it only as told by your health care provider. Check the skin around the splint or boot every day. Tell your health care provider about any concerns. Loosen the splint or boot if your fingers or toes tingle, become numb, or turn cold and blue. Keep the splint or boot clean and dry. Bathing Do not take baths, swim, or use a hot tub until your health care provider approves. Ask your health care provider if you may take showers. You may only be allowed to take sponge baths. Keep the bandage (dressing) dry until your health care provider says it can be removed. If the cast, splint, or boot is not waterproof: Do not let it get wet. Cover it with a watertight covering when you take a bath or a shower. Incision care  Follow instructions from your health care provider about how to take care of your incision. Make sure you: Wash your hands with soap and water for at least 20 seconds before and after you change your dressing. If soap and water are not available, use hand sanitizer. Change your dressing as told by your health care provider. Leave stitches (sutures), staples, skin glue, or adhesive strips in place. These skin closures may need to stay in place for 2 weeks or longer. If adhesive strip edges start to loosen and curl up, you may trim the loose edges. Do not remove adhesive strips completely unless your health care provider tells you to do that. Check your incision area every day for signs of infection. Check for: More redness, swelling, or pain. More fluid or blood. Warmth. Pus or a bad smell. Managing pain, stiffness, and swelling  If directed, put ice on the affected area. To do this: If you have a removable splint or boot, remove it as told by your health care provider. Put ice in a plastic bag. Place a towel between your skin and the bag or between your cast and the bag. Leave the ice on for 20  minutes, 2-3 times a day. If your skin turns bright red, remove the ice right away to prevent skin damage. The risk of skin damage is higher if you cannot feel pain, heat, or cold. Move your fingers or toes often to reduce stiffness and swelling. Raise (elevate) the injured area above the level of your heart while you are sitting or lying down. Activity Rest as told by your health care provider. Do not sit for a long time without moving. Get up to take short walks every 1-2 hours. This will improve blood flow  and breathing. Ask for help if you feel weak or unsteady. Do not use the injured limb to support your body weight until your health care provider says that you can. Ask your health care provider when it is safe to drive if you have a cast, splint, or boot on your affected limb. Do exercises as told by your health care provider. Return to your normal activities as told by your health care provider. Ask your health care provider what activities are safe for you. General instructions Do not use any products that contain nicotine or tobacco. These products include cigarettes, chewing tobacco, and vaping devices, such as e-cigarettes. These can delay bone healing. If you need help quitting, ask your health care provider. Wear compression stockings as told by your health care provider. These stockings help to prevent blood clots and reduce swelling in your legs. Keep all follow-up visits. Your health care provider may need to monitor your healing and check for problems. Contact a health care provider if: You have numbness for more than 24 hours in the area where the hardware was removed. You have signs of infection at your incision area. You have a fever or chills. Your pain is not controlled by medicine. You are unable to do exercises or physical activity as told by your health care provider. Get help right away if: You have trouble breathing. You have chest pain. These symptoms may be an  emergency. Get help right away. Call 911. Do not wait to see if the symptoms will go away. Do not drive yourself to the hospital. This information is not intended to replace advice given to you by your health care provider. Make sure you discuss any questions you have with your health care provider. Document Revised: 09/24/2021 Document Reviewed: 09/24/2021 Elsevier Patient Education  2024 [employer] General Anesthesia, Adult, Care After The following information offers guidance on how to care for yourself after your procedure. Your health care provider may also give you more specific instructions. If you have problems or questions, contact your health care provider. What can I expect after the procedure? After the procedure, it is common for people to: Have pain or discomfort at the IV site. Have nausea or vomiting. Have a sore throat or hoarseness. Have trouble concentrating. Feel cold or chills. Feel weak, sleepy, or tired (fatigue). Have soreness and body aches. These can affect parts of the body that were not involved in surgery. Follow these instructions at home: For the time period you were told by your health care provider:  Rest. Do not participate in activities where you could fall or become injured. Do not drive or use machinery. Do not drink alcohol. Do not take sleeping pills or medicines that cause drowsiness. Do not make important decisions or sign legal documents. Do not take care of children on your own. General instructions Drink enough fluid to keep your urine pale yellow. If you have sleep apnea, surgery and certain medicines can increase your risk for breathing problems. Follow instructions from your health care provider about wearing your sleep device: Anytime you are sleeping, including during daytime naps. While taking prescription pain medicines, sleeping medicines, or medicines that make you drowsy. Return to your normal activities as told by your health  care provider. Ask your health care provider what activities are safe for you. Take over-the-counter and prescription medicines only as told by your health care provider. Do not use any products that contain nicotine or tobacco. These products include cigarettes, chewing tobacco,  and vaping devices, such as e-cigarettes. These can delay incision healing after surgery. If you need help quitting, ask your health care provider. Contact a health care provider if: You have nausea or vomiting that does not get better with medicine. You vomit every time you eat or drink. You have pain that does not get better with medicine. You cannot urinate or have bloody urine. You develop a skin rash. You have a fever. Get help right away if: You have trouble breathing. You have chest pain. You vomit blood. These symptoms may be an emergency. Get help right away. Call 911. Do not wait to see if the symptoms will go away. Do not drive yourself to the hospital. Summary After the procedure, it is common to have a sore throat, hoarseness, nausea, vomiting, or to feel weak, sleepy, or fatigue. For the time period you were told by your health care provider, do not drive or use machinery. Get help right away if you have difficulty breathing, have chest pain, or vomit blood. These symptoms may be an emergency. This information is not intended to replace advice given to you by your health care provider. Make sure you discuss any questions you have with your health care provider. Document Revised: 07/30/2021 Document Reviewed: 07/30/2021 Elsevier Patient Education  2024 [employer] How to Use Chlorhexidine Before Surgery Chlorhexidine gluconate (CHG) is a germ-killing (antiseptic) solution that is used to clean the skin. It can get rid of the bacteria that normally live on the skin and can keep them away for about 24 hours. To clean your skin with CHG, you may be given: A CHG solution to use in the shower or as part  of a sponge bath. A prepackaged cloth that contains CHG. Cleaning your skin with CHG may help lower the risk for infection: While you are staying in the intensive care unit of the hospital. If you have a vascular access, such as a central line, to provide short-term or long-term access to your veins. If you have a catheter to drain urine from your bladder. If you are on a ventilator. A ventilator is a machine that helps you breathe by moving air in and out of your lungs. After surgery. What are the risks? Risks of using CHG include: A skin reaction. Hearing loss, if CHG gets in your ears and you have a perforated eardrum. Eye injury, if CHG gets in your eyes and is not rinsed out. The CHG product catching fire. Make sure that you avoid smoking and flames after applying CHG to your skin. Do not use CHG: If you have a chlorhexidine allergy or have previously reacted to chlorhexidine. On babies younger than 26 months of age. How to use CHG solution Use CHG only as told by your health care provider, and follow the instructions on the label. Use the full amount of CHG as directed. Usually, this is one bottle. During a shower Follow these steps when using CHG solution during a shower (unless your health care provider gives you different instructions): Start the shower. Use your normal soap and shampoo to wash your face and hair. Turn off the shower or move out of the shower stream. Pour the CHG onto a clean washcloth. Do not use any type of brush or rough-edged sponge. Starting at your neck, lather your body down to your toes. Make sure you follow these instructions: If you will be having surgery, pay special attention to the part of your body where you will be having surgery.  Scrub this area for at least 1 minute. Do not use CHG on your head or face. If the solution gets into your ears or eyes, rinse them well with water. Avoid your genital area. Avoid any areas of skin that have broken  skin, cuts, or scrapes. Scrub your back and under your arms. Make sure to wash skin folds. Let the lather sit on your skin for 1-2 minutes or as long as told by your health care provider. Thoroughly rinse your entire body in the shower. Make sure that all body creases and crevices are rinsed well. Dry off with a clean towel. Do not put any substances on your body afterward--such as powder, lotion, or perfume--unless you are told to do so by your health care provider. Only use lotions that are recommended by the manufacturer. Put on clean clothes or pajamas. If it is the night before your surgery, sleep in clean sheets.  During a sponge bath Follow these steps when using CHG solution during a sponge bath (unless your health care provider gives you different instructions): Use your normal soap and shampoo to wash your face and hair. Pour the CHG onto a clean washcloth. Starting at your neck, lather your body down to your toes. Make sure you follow these instructions: If you will be having surgery, pay special attention to the part of your body where you will be having surgery. Scrub this area for at least 1 minute. Do not use CHG on your head or face. If the solution gets into your ears or eyes, rinse them well with water. Avoid your genital area. Avoid any areas of skin that have broken skin, cuts, or scrapes. Scrub your back and under your arms. Make sure to wash skin folds. Let the lather sit on your skin for 1-2 minutes or as long as told by your health care provider. Using a different clean, wet washcloth, thoroughly rinse your entire body. Make sure that all body creases and crevices are rinsed well. Dry off with a clean towel. Do not put any substances on your body afterward--such as powder, lotion, or perfume--unless you are told to do so by your health care provider. Only use lotions that are recommended by the manufacturer. Put on clean clothes or pajamas. If it is the night before your  surgery, sleep in clean sheets. How to use CHG prepackaged cloths Only use CHG cloths as told by your health care provider, and follow the instructions on the label. Use the CHG cloth on clean, dry skin. Do not use the CHG cloth on your head or face unless your health care provider tells you to. When washing with the CHG cloth: Avoid your genital area. Avoid any areas of skin that have broken skin, cuts, or scrapes. Before surgery Follow these steps when using a CHG cloth to clean before surgery (unless your health care provider gives you different instructions): Using the CHG cloth, vigorously scrub the part of your body where you will be having surgery. Scrub using a back-and-forth motion for 3 minutes. The area on your body should be completely wet with CHG when you are done scrubbing. Do not rinse. Discard the cloth and let the area air-dry. Do not put any substances on the area afterward, such as powder, lotion, or perfume. Put on clean clothes or pajamas. If it is the night before your surgery, sleep in clean sheets.  For general bathing Follow these steps when using CHG cloths for general bathing (unless your health care  provider gives you different instructions). Use a separate CHG cloth for each area of your body. Make sure you wash between any folds of skin and between your fingers and toes. Wash your body in the following order, switching to a new cloth after each step: The front of your neck, shoulders, and chest. Both of your arms, under your arms, and your hands. Your stomach and groin area, avoiding the genitals. Your right leg and foot. Your left leg and foot. The back of your neck, your back, and your buttocks. Do not rinse. Discard the cloth and let the area air-dry. Do not put any substances on your body afterward--such as powder, lotion, or perfume--unless you are told to do so by your health care provider. Only use lotions that are recommended by the manufacturer. Put on  clean clothes or pajamas. Contact a health care provider if: Your skin gets irritated after scrubbing. You have questions about using your solution or cloth. You swallow any chlorhexidine. Call your local poison control center (640-498-1103 in the [place] Get help right away if: Your eyes itch badly, or they become very red or swollen. Your skin itches badly and is red or swollen. Your hearing changes. You have trouble seeing. You have swelling or tingling in your mouth or throat. You have trouble breathing. These symptoms may represent a serious problem that is an emergency. Do not wait to see if the symptoms will go away. Get medical help right away. Call your local emergency services (911 in the [place] Do not drive yourself to the hospital. Summary Chlorhexidine gluconate (CHG) is a germ-killing (antiseptic) solution that is used to clean the skin. Cleaning your skin with CHG may help to lower your risk for infection. You may be given CHG to use for bathing. It may be in a bottle or in a prepackaged cloth to use on your skin. Carefully follow your health care provider's instructions and the instructions on the product label. Do not use CHG if you have a chlorhexidine allergy. Contact your health care provider if your skin gets irritated after scrubbing. This information is not intended to replace advice given to you by your health care provider. Make sure you discuss any questions you have with your health care provider. Document Revised: 08/30/2021 Document Reviewed: 07/13/2020 Elsevier Patient Education  2023 [employer].

## 2023-05-11 ENCOUNTER — Other Ambulatory Visit (HOSPITAL_COMMUNITY)
Admission: RE | Admit: 2023-05-11 | Discharge: 2023-05-11 | Disposition: A | Discharge status: Home / Self Care | Payer: Medicare Other | Source: Ambulatory Visit | Attending: Orthopedic Surgery | Admitting: Orthopedic Surgery

## 2023-05-11 ENCOUNTER — Encounter (HOSPITAL_COMMUNITY)
Admission: RE | Admit: 2023-05-11 | Discharge: 2023-05-11 | Disposition: A | Discharge status: Home / Self Care | Payer: Medicare Other | Source: Ambulatory Visit | Attending: Orthopedic Surgery | Admitting: Orthopedic Surgery

## 2023-05-11 ENCOUNTER — Encounter (HOSPITAL_COMMUNITY): Payer: Self-pay

## 2023-05-11 DIAGNOSIS — Z01812 Encounter for preprocedural laboratory examination: Secondary | ICD-10-CM | POA: Insufficient documentation

## 2023-05-11 DIAGNOSIS — Z01818 Encounter for other preprocedural examination: Secondary | ICD-10-CM

## 2023-05-11 LAB — BASIC METABOLIC PANEL
Anion gap: 9 (ref 5–15)
BUN: 14 mg/dL (ref 8–23)
CO2: 24 mmol/L (ref 22–32)
Calcium: 9.4 mg/dL (ref 8.9–10.3)
Chloride: 103 mmol/L (ref 98–111)
Creatinine, Ser: 0.62 mg/dL (ref 0.44–1.00)
GFR, Estimated: >60 mL/min (ref >60)
Glucose, Bld: 124 mg/dL — ABNORMAL HIGH (ref 70–99)
Potassium: 3.9 mmol/L (ref 3.5–5.1)
Sodium: 136 mmol/L (ref 135–145)

## 2023-05-11 LAB — CBC
HCT: 42.1 % (ref 36.0–46.0)
Hemoglobin: 14.3 g/dL (ref 12.0–15.0)
MCH: 30 pg (ref 26.0–34.0)
MCHC: 34 g/dL (ref 30.0–36.0)
MCV: 88.3 fL (ref 80.0–100.0)
Platelets: 293 10*3/uL (ref 150–400)
RBC: 4.77 MIL/uL (ref 3.87–5.11)
RDW: 12.1 % (ref 11.5–15.5)
WBC: 8.1 10*3/uL (ref 4.0–10.5)
nRBC: 0 % (ref 0.0–0.2)

## 2023-05-15 ENCOUNTER — Encounter (HOSPITAL_COMMUNITY)
Admission: RE | Disposition: A | Discharge status: Home / Self Care | Payer: Self-pay | Source: Home / Self Care | Attending: Orthopedic Surgery

## 2023-05-15 ENCOUNTER — Ambulatory Visit (HOSPITAL_COMMUNITY): Payer: Medicare Other

## 2023-05-15 ENCOUNTER — Encounter (HOSPITAL_COMMUNITY): Payer: Self-pay | Admitting: Orthopedic Surgery

## 2023-05-15 ENCOUNTER — Telehealth: Payer: Self-pay | Admitting: Orthopedic Surgery

## 2023-05-15 ENCOUNTER — Ambulatory Visit (HOSPITAL_BASED_OUTPATIENT_CLINIC_OR_DEPARTMENT_OTHER): Payer: Medicare Other | Admitting: Anesthesiology

## 2023-05-15 ENCOUNTER — Ambulatory Visit (HOSPITAL_COMMUNITY): Payer: Self-pay | Admitting: Anesthesiology

## 2023-05-15 ENCOUNTER — Ambulatory Visit (HOSPITAL_COMMUNITY)
Admission: RE | Admit: 2023-05-15 | Discharge: 2023-05-15 | Disposition: A | Discharge status: Home / Self Care | Payer: Medicare Other | Attending: Orthopedic Surgery | Admitting: Orthopedic Surgery

## 2023-05-15 ENCOUNTER — Other Ambulatory Visit: Payer: Self-pay

## 2023-05-15 DIAGNOSIS — M6702 Short Achilles tendon (acquired), left ankle: Secondary | ICD-10-CM

## 2023-05-15 DIAGNOSIS — T8484XA Pain due to internal orthopedic prosthetic devices, implants and grafts, initial encounter: Secondary | ICD-10-CM | POA: Diagnosis present

## 2023-05-15 DIAGNOSIS — Y798 Miscellaneous orthopedic devices associated with adverse incidents, not elsewhere classified: Secondary | ICD-10-CM | POA: Diagnosis not present

## 2023-05-15 DIAGNOSIS — S82852D Displaced trimalleolar fracture of left lower leg, subsequent encounter for closed fracture with routine healing: Secondary | ICD-10-CM | POA: Insufficient documentation

## 2023-05-15 DIAGNOSIS — Z87891 Personal history of nicotine dependence: Secondary | ICD-10-CM | POA: Diagnosis not present

## 2023-05-15 DIAGNOSIS — R7303 Prediabetes: Secondary | ICD-10-CM | POA: Diagnosis not present

## 2023-05-15 DIAGNOSIS — X58XXXD Exposure to other specified factors, subsequent encounter: Secondary | ICD-10-CM | POA: Diagnosis not present

## 2023-05-15 DIAGNOSIS — S82852A Displaced trimalleolar fracture of left lower leg, initial encounter for closed fracture: Secondary | ICD-10-CM

## 2023-05-15 DIAGNOSIS — K219 Gastro-esophageal reflux disease without esophagitis: Secondary | ICD-10-CM | POA: Insufficient documentation

## 2023-05-15 DIAGNOSIS — Z01818 Encounter for other preprocedural examination: Secondary | ICD-10-CM

## 2023-05-15 HISTORY — PX: HARDWARE REMOVAL: SHX979

## 2023-05-15 HISTORY — PX: GASTROCNEMIUS RECESSION: SHX863

## 2023-05-15 LAB — GLUCOSE, CAPILLARY: Glucose-Capillary: 126 mg/dL — ABNORMAL HIGH (ref 70–99)

## 2023-05-15 SURGERY — REMOVAL, HARDWARE
Anesthesia: General | Site: Leg Lower | Laterality: Left

## 2023-05-15 MED ORDER — LIDOCAINE HCL (PF) 2 % IJ SOLN
INTRAMUSCULAR | Status: DC | PRN
  Administered 2023-05-15: 100 mg via INTRADERMAL

## 2023-05-15 MED ORDER — LIDOCAINE HCL (PF) 2 % IJ SOLN
INTRAMUSCULAR | Status: AC
  Filled 2023-05-15: qty 5

## 2023-05-15 MED ORDER — EPHEDRINE 5 MG/ML INJ
INTRAVENOUS | Status: AC
  Filled 2023-05-15: qty 5

## 2023-05-15 MED ORDER — ACETAMINOPHEN 500 MG PO TABS
1000.0000 mg | ORAL_TABLET | Freq: Once | ORAL | Status: AC
  Administered 2023-05-15: 1000 mg via ORAL

## 2023-05-15 MED ORDER — PHENYLEPHRINE 80 MCG/ML (10ML) SYRINGE FOR IV PUSH (FOR BLOOD PRESSURE SUPPORT)
PREFILLED_SYRINGE | INTRAVENOUS | Status: AC
  Filled 2023-05-15: qty 10

## 2023-05-15 MED ORDER — DICLOFENAC SODIUM 50 MG PO TBEC: 50.0000 mg | DELAYED_RELEASE_TABLET | Freq: Two times a day (BID) | ORAL | 0 refills | Status: AC

## 2023-05-15 MED ORDER — FENTANYL CITRATE (PF) 250 MCG/5ML IJ SOLN
INTRAMUSCULAR | Status: DC | PRN
  Administered 2023-05-15 (×3): 50 ug via INTRAVENOUS

## 2023-05-15 MED ORDER — PROPOFOL 10 MG/ML IV BOLUS
INTRAVENOUS | Status: AC
Start: 2023-05-15 — End: ?
  Filled 2023-05-15: qty 20

## 2023-05-15 MED ORDER — FENTANYL CITRATE (PF) 250 MCG/5ML IJ SOLN
INTRAMUSCULAR | Status: AC
  Filled 2023-05-15: qty 5

## 2023-05-15 MED ORDER — GLYCOPYRROLATE PF 0.2 MG/ML IJ SOSY
PREFILLED_SYRINGE | INTRAMUSCULAR | Status: DC | PRN
  Administered 2023-05-15 (×2): .1 mg via INTRAVENOUS

## 2023-05-15 MED ORDER — CEFAZOLIN SODIUM-DEXTROSE 2-4 GM/100ML-% IV SOLN
INTRAVENOUS | Status: AC
  Filled 2023-05-15: qty 100

## 2023-05-15 MED ORDER — ACETAMINOPHEN 160 MG/5ML PO SOLN
960.0000 mg | Freq: Once | ORAL | Status: AC
  Filled 2023-05-15: qty 30

## 2023-05-15 MED ORDER — PHENYLEPHRINE 80 MCG/ML (10ML) SYRINGE FOR IV PUSH (FOR BLOOD PRESSURE SUPPORT)
PREFILLED_SYRINGE | INTRAVENOUS | Status: DC | PRN
  Administered 2023-05-15 (×4): 160 ug via INTRAVENOUS
  Administered 2023-05-15: 80 ug via INTRAVENOUS
  Administered 2023-05-15: 160 ug via INTRAVENOUS
  Administered 2023-05-15: 80 ug via INTRAVENOUS

## 2023-05-15 MED ORDER — DEXAMETHASONE SODIUM PHOSPHATE 10 MG/ML IJ SOLN
INTRAMUSCULAR | Status: DC | PRN
  Administered 2023-05-15: 5 mg via INTRAVENOUS

## 2023-05-15 MED ORDER — VANCOMYCIN HCL 1000 MG IV SOLR
INTRAVENOUS | Status: AC
  Filled 2023-05-15: qty 20

## 2023-05-15 MED ORDER — ACETAMINOPHEN 500 MG PO TABS
ORAL_TABLET | ORAL | Status: AC
  Filled 2023-05-15: qty 2

## 2023-05-15 MED ORDER — OXYCODONE HCL 5 MG PO TABS
5.0000 mg | ORAL_TABLET | Freq: Once | ORAL | Status: AC | PRN
  Administered 2023-05-15: 5 mg via ORAL
  Filled 2023-05-15: qty 1

## 2023-05-15 MED ORDER — ONDANSETRON HCL 4 MG/2ML IJ SOLN
INTRAMUSCULAR | Status: DC | PRN
  Administered 2023-05-15: 4 mg via INTRAVENOUS

## 2023-05-15 MED ORDER — ONDANSETRON HCL 4 MG PO TABS: 4.0000 mg | ORAL_TABLET | Freq: Three times a day (TID) | ORAL | 0 refills | Status: AC | PRN

## 2023-05-15 MED ORDER — HYDROMORPHONE HCL 1 MG/ML IJ SOLN
0.2500 mg | INTRAMUSCULAR | Status: DC | PRN
Start: 2023-05-15 — End: 2023-05-15
  Administered 2023-05-15: 0.5 mg via INTRAVENOUS
  Filled 2023-05-15: qty 0.5

## 2023-05-15 MED ORDER — ASPIRIN 81 MG PO TBEC: 81.0000 mg | DELAYED_RELEASE_TABLET | Freq: Two times a day (BID) | ORAL | 0 refills | Status: AC

## 2023-05-15 MED ORDER — CEFAZOLIN SODIUM-DEXTROSE 2-4 GM/100ML-% IV SOLN
2.0000 g | INTRAVENOUS | Status: AC
  Administered 2023-05-15: 2 g via INTRAVENOUS

## 2023-05-15 MED ORDER — CHLORHEXIDINE GLUCONATE 0.12 % MT SOLN
OROMUCOSAL | Status: AC
  Filled 2023-05-15: qty 15

## 2023-05-15 MED ORDER — GLYCOPYRROLATE PF 0.2 MG/ML IJ SOSY
PREFILLED_SYRINGE | INTRAMUSCULAR | Status: AC
  Filled 2023-05-15: qty 1

## 2023-05-15 MED ORDER — PROPOFOL 10 MG/ML IV BOLUS
INTRAVENOUS | Status: DC | PRN
  Administered 2023-05-15: 200 mg via INTRAVENOUS

## 2023-05-15 MED ORDER — EPHEDRINE SULFATE-NACL 50-0.9 MG/10ML-% IV SOSY
PREFILLED_SYRINGE | INTRAVENOUS | Status: DC | PRN
  Administered 2023-05-15 (×3): 5 mg via INTRAVENOUS

## 2023-05-15 MED ORDER — ONDANSETRON HCL 4 MG/2ML IJ SOLN
4.0000 mg | Freq: Once | INTRAMUSCULAR | Status: DC | PRN
Start: 2023-05-15 — End: 2023-05-15

## 2023-05-15 MED ORDER — OXYCODONE HCL 5 MG/5ML PO SOLN
5.0000 mg | Freq: Once | ORAL | Status: AC | PRN
Start: 2023-05-15 — End: 2023-05-15

## 2023-05-15 MED ORDER — 0.9 % SODIUM CHLORIDE (POUR BTL) OPTIME
TOPICAL | Status: DC | PRN
  Administered 2023-05-15: 1000 mL

## 2023-05-15 MED ORDER — DEXAMETHASONE SODIUM PHOSPHATE 10 MG/ML IJ SOLN
INTRAMUSCULAR | Status: AC
  Filled 2023-05-15: qty 1

## 2023-05-15 MED ORDER — MIDAZOLAM HCL 2 MG/2ML IJ SOLN
INTRAMUSCULAR | Status: AC
  Filled 2023-05-15: qty 2

## 2023-05-15 MED ORDER — CHLORHEXIDINE GLUCONATE 0.12 % MT SOLN
15.0000 mL | Freq: Once | OROMUCOSAL | Status: AC
  Administered 2023-05-15: 15 mL via OROMUCOSAL

## 2023-05-15 MED ORDER — LACTATED RINGERS IV SOLN: INTRAVENOUS | Status: DC | PRN

## 2023-05-15 MED ORDER — DEXMEDETOMIDINE HCL IN NACL 80 MCG/20ML IV SOLN
INTRAVENOUS | Status: AC
  Filled 2023-05-15: qty 20

## 2023-05-15 MED ORDER — ONDANSETRON HCL 4 MG/2ML IJ SOLN
INTRAMUSCULAR | Status: AC
  Filled 2023-05-15: qty 2

## 2023-05-15 MED ORDER — MIDAZOLAM HCL 2 MG/2ML IJ SOLN
INTRAMUSCULAR | Status: DC | PRN
  Administered 2023-05-15: 2 mg via INTRAVENOUS

## 2023-05-15 MED ORDER — HYDROCODONE-ACETAMINOPHEN 5-325 MG PO TABS: 1.0000 | ORAL_TABLET | ORAL | 0 refills | Status: AC | PRN

## 2023-05-15 MED ORDER — ORAL CARE MOUTH RINSE: 15.0000 mL | Freq: Once | OROMUCOSAL | Status: AC

## 2023-05-15 MED ORDER — BUPIVACAINE HCL (PF) 0.5 % IJ SOLN
INTRAMUSCULAR | Status: DC | PRN
  Administered 2023-05-15: 30 mL

## 2023-05-15 SURGICAL SUPPLY — 42 items
BANDAGE ESMARK 4X12 BL STRL LF (DISPOSABLE) ×2 IMPLANT
BENZOIN TINCTURE PRP APPL 2/3 (GAUZE/BANDAGES/DRESSINGS) IMPLANT
BLADE SURG 15 STRL LF DISP TIS (BLADE) ×2 IMPLANT
BNDG COHESIVE 4X5 TAN STRL (GAUZE/BANDAGES/DRESSINGS) ×2 IMPLANT
BNDG COHESIVE 4X5 TAN STRL LF (GAUZE/BANDAGES/DRESSINGS) IMPLANT
BNDG ELASTIC 6X5.8 VLCR NS LF (GAUZE/BANDAGES/DRESSINGS) IMPLANT
BNDG ESMARK 4X12 BLUE STRL LF (DISPOSABLE) ×2
BOOT STEPPER DURA LG (SOFTGOODS) IMPLANT
CHLORAPREP W/TINT 26 (MISCELLANEOUS) IMPLANT
CLOTH BEACON ORANGE TIMEOUT ST (SAFETY) ×2 IMPLANT
COVER LIGHT HANDLE STERIS (MISCELLANEOUS) ×2 IMPLANT
COVER MAYO STAND XLG (MISCELLANEOUS) IMPLANT
CUFF TRNQT CYL 24X4X16.5-23 (TOURNIQUET CUFF) IMPLANT
DECANTER SPIKE VIAL GLASS SM (MISCELLANEOUS) ×2 IMPLANT
DRAPE C-ARMOR (DRAPES) IMPLANT
DRAPE U-SHAPE 47X51 STRL (DRAPES) ×2 IMPLANT
DRSG TEGADERM 4X4.75 (GAUZE/BANDAGES/DRESSINGS) IMPLANT
ELECT REM PT RETURN 9FT ADLT (ELECTROSURGICAL) ×2
ELECTRODE REM PT RTRN 9FT ADLT (ELECTROSURGICAL) ×2 IMPLANT
GAUZE SPONGE 4X4 12PLY STRL (GAUZE/BANDAGES/DRESSINGS) IMPLANT
GLOVE BIOGEL PI IND STRL 7.0 (GLOVE) ×4 IMPLANT
GOWN STRL REUS W/TWL LRG LVL3 (GOWN DISPOSABLE) ×2 IMPLANT
GOWN STRL REUS W/TWL XL LVL3 (GOWN DISPOSABLE) ×4 IMPLANT
KIT GASTROC RECESSION INST (INSTRUMENTS) IMPLANT
KIT TURNOVER KIT A (KITS) ×2 IMPLANT
MANIFOLD NEPTUNE II (INSTRUMENTS) ×2 IMPLANT
NDL HYPO 21X1.5 SAFETY (NEEDLE) IMPLANT
NEEDLE HYPO 21X1.5 SAFETY (NEEDLE) ×2
NS IRRIG 1000ML POUR BTL (IV SOLUTION) ×2 IMPLANT
PACK BASIC LIMB (CUSTOM PROCEDURE TRAY) ×2 IMPLANT
PAD ABD 5X9 TENDERSORB (GAUZE/BANDAGES/DRESSINGS) ×2 IMPLANT
PAD ARMBOARD 7.5X6 YLW CONV (MISCELLANEOUS) ×4 IMPLANT
PAD CAST 4YDX4 CTTN HI CHSV (CAST SUPPLIES) ×2 IMPLANT
POSITIONER HEAD 8X9X4 ADT (SOFTGOODS) ×2 IMPLANT
SET BASIN LINEN APH (SET/KITS/TRAYS/PACK) ×2 IMPLANT
SPONGE T-LAP 18X18 ~~LOC~~+RFID (SPONGE) ×2 IMPLANT
STRIP CLOSURE SKIN 1/2X4 (GAUZE/BANDAGES/DRESSINGS) IMPLANT
SUT MNCRL AB 4-0 PS2 18 (SUTURE) ×2 IMPLANT
SUT MON AB 2-0 SH27 (SUTURE) IMPLANT
SYR 20ML LL LF (SYRINGE) IMPLANT
SYR 30ML LL (SYRINGE) ×2 IMPLANT
SYR BULB IRRIG 60ML STRL (SYRINGE) ×2 IMPLANT

## 2023-05-15 NOTE — H&P (Signed)
Below is the most recent clinic note for Kim Mann; any pertinent information regarding their recent medical history will be updated on the day of surgery.   Orthopaedic Postop Note  Assessment: Kim Mann is a 72 y.o. female s/p ORIF of Left ankle fracture DOS: 06/13/2022  Plan: Mrs. Monley continues to have pain in the left ankle.  She does have occasional swelling.  Pain is diffuse.  She is difficulty ambulating.  She has not been able to return to her previous level of function.  Radiographs remained stable.  I do think she has healed her fracture.  It is possible that the hardware is contributing to some of her ongoing pain.  In addition, she did not regain dorsiflexion following surgery.  We have discussed all of this multiple times in clinic.  At this point, she is interested to having the hardware removed.  I advised her that if we plan to remove all hardware, I would also plan to proceed with a gastroc recession.  This procedure was discussed.  This would give her more dorsiflexion.  I believe this would allow her to return to her previous level of function.  She states understanding.  She is in agreement with this plan.  She will return to nonweightbearing status, in a boot for at least a couple weeks following surgery.  She will be consented for hardware removal from the left ankle, as well as left gastrocnemius recession.  This will be done endoscopically.  Risks and benefits of the surgery, including, but not limited to infection, bleeding, persistent pain, stiffness, blood clots, need for further surgery, non-union, malunion and more severe complications associated with anesthesia were discussed with the patient.  The patient has elected to proceed.    Follow-up: No follow-ups on file. XR at next visit: Left ankle  Subjective:  No chief complaint on file.   History of Present Illness: Kim Mann is a 72 y.o. female who presents following the above stated  procedure.  Surgery was approximately 10 months ago.  She remains frustrated with her lack of progression.  She states that she is in pain all the time.  She describes radiating pains in both the medial and lateral aspect of the ankle.  She describes a tightness over the anterior aspect of the ankle.  She has not been taking NSAIDs.  She states the gabapentin helped, but she was only taking this as needed.  She notes occasional swelling.  She has not been able to return to her previous level of function.   Review of Systems: No fevers or chills No numbness or tingling No Chest Pain No shortness of breath   Objective: BP (!) 175/63   Pulse 79   Temp 97.7 F (36.5 C) (Oral)   Resp 19   Ht 5\' 7"  (1.702 m)   Wt 71 kg   SpO2 98%   BMI 24.52 kg/m   Physical Exam:  Alert and oriented.  No acute distress.    left-sided antalgic gait.  Left foot external rotation  Medial lateral surgical incisions are healed.  No surrounding erythema or drainage.  No specific point tenderness.  Generalized tenderness over the lateral ankle.  She does have some tenderness over the medial screws.  Just a few degrees of dorsiflexion, with her knee bent.  No tenderness within the Achilles.  Toes are warm and well-perfused.  Sensation intact to the dorsum of the foot.  IMAGING: I personally ordered and reviewed the following images  X-ray of the left ankle were obtained in clinic today.  These are compared to prior x-rays.  Hardware remains intact.  Screws of both the medial and lateral malleoli remain in good position.  Screws not backing out.  Fracture lines are not visible.  Posterior malleolus fracture fragment remains in good position.  Mild loss of joint space within the tibiotalar joint.  No bony lesions.  Impression: Healed left trimalleolar ankle fracture without hardware failure   Oliver Barre, MD 05/15/2023 7:17 AM

## 2023-05-15 NOTE — Op Note (Signed)
Orthopaedic Surgery Operative Note (CSN: [id number])  Kim Mann  Jun 29, 1950 Date of Surgery: 05/15/2023   Diagnoses:  1.  Left trimalleolar ankle fracture; healed 2.  Painful orthopedic hardware left ankle 3.  Left Achilles tendon contracture  Procedure: 1.  Removal of hardware from left ankle 2.  Left endoscopic gastrocnemius recession   Operative Finding Successful completion of the planned procedure.  All hardware removed without issue.  Complete gastroc recession under direct visualization.   Post-Op Diagnosis: Same Surgeons:Primary: Oliver Barre, MD Assistants: Westly Pam Location: [facility] OR ROOM 4 Anesthesia: General with regional anesthesia Antibiotics: Ancef 2 g Tourniquet time:  Total Tourniquet Time Documented: Thigh (Left) - 59 minutes Total: Thigh (Left) - 59 minutes  Estimated Blood Loss: 5 cc Complications: None Specimens: None Implants:  The following implants were removed Implant Name Type Inv. Item Serial No. Manufacturer Lot No. LRB No. Used Action  PLATE DIST FB LT 5H - [id number] Plate PLATE DIST FB LT 5H  [employer] STERILE ON SET Left 1 Explanted  SCREW COMP KREULOCK 2.7X10 - [id number] Screw SCREW COMP KREULOCK 2.7X10  [employer] STERILE ON SET Left 1 Explanted  SCREW COMP KREULOCK 2.7X12 - [id number] Screw SCREW COMP KREULOCK 2.7X12  [employer] STERILE ON SET Left 1 Explanted  SCREW COMP KREULOCK 2.7X14 - [id number] Screw SCREW COMP KREULOCK 2.7X14  [employer] STERILE ON SET Left 2 Explanted  SCREW CORT 2.7X20 - [id number] Screw SCREW BN T10 FT 20X2.7XST CORT  [employer] STERILE ON SET Left 1 Explanted  SCREW LOCKING CANN 4X55 - [id number] Screw SCREW LOCKING CANN 4X55  [employer] STERILE ON SET Left 2 Explanted  SCREW LOW PROFILE 3.5X14 - [id number] Screw SCREW LOW PROFILE 3.5X14  [employer] STERILE ON SET Left 1 Explanted  SCREW NON-LOCKING 3.5X12MM - [id number] Screw SCREW NON-LOCKING 3.5X12MM  [employer] STERILE ON SET Left 2 Explanted     Indications for Surgery:   Kim Mann is a 72 y.o. female who sustained a left trimalleolar ankle fracture, and underwent operative fixation.  She went on to heal the fracture.  She was able to regain function, but continues to have pain, and limited motion of the left ankle.  Initial injury was almost a year ago.  She continues to have pain, directly overlying the existing hardware.  Given the improvements in pain, and the time since surgery, I was confident that the fracture had healed.  As such, we discussed removal of hardware.  She is interested.  In addition, I feel that the lack of dorsiflexion was affecting her ability to return to her previous level of function.  As such, we discussed proceeding with an endoscopic gastrocnemius recession.  Benefits and risks of operative and nonoperative management were discussed prior to surgery with the patient and informed consent form was completed.  Specific risks including infection, need for additional surgery, bleeding, damage to surrounding structure, persistent pain, persistent stiffness, recurrent fracture, blood clots and more severe complications associated with anesthesia.  All questions were answered.  She elected to proceed.   Procedure:   The patient was identified properly. Informed consent was obtained and the surgical site was marked. The patient was taken to the OR where general anesthesia was induced.  The patient was positioned supine with her leg on bone foam.  The left leg was prepped and draped in the usual sterile fashion.  Timeout was performed before the beginning of the case.  Tourniquet was used for the above duration.  She received 2 g of Ancef prior to making incision.  We started on the medial aspect of the ankle.  Prior incisions were identified.  Under direct fluoroscopic guidance, we introduced a K wire.  We were able to palpate the tip of the screw, and subsequently inserted the K wire into the cannulated screw.  The  for screw was removed using a screwdriver, under direct visualization using fluoroscopy.  This procedure was then completed to remove the second screw.  There were no complications.  We then turned our attention to the lateral aspect of the ankle.  We used the distal 75% of the incision.  We incised sharply through skin.  There is minimal subcutaneous tissue.  The plate was identified, and cleared off.  We then sequentially removed all screws.  Once all screws have been removed, and this was confirmed under fluoroscopy, we introduced a Freer elevator underneath the edge of the plate.  We were able to carefully remove the plate with little difficulty.  The lateral fibula was relatively smooth.  We did use a key elevator to remove any scar tissue, or bone that had formed underneath the plate.  We then dissected posterior to the plate, and identified the lag screw.  This was then removed under direct visualization, using fluoroscopy.  Fluoroscopy at this point confirmed that the ankle remained stable.  There was no motion through the fracture site.  All hardware had been removed without complication.  We then proceeded to the endoscopic portion of the case.  The musculotendinous junction was identified.  We made an incision on the medial gastroc, approximate 2 cm distal to the musculotendinous junction.  The fascia was pierced with a hemostat, which continued just superficial to the tendon.  We then introduced the cannula, and proceeded to create a path directly on top of the tendon to the lateral side.  We then made a lateral portal incision, and the cannula was pushed across.  At this point, we used several Q-tips to clear the path within the cannula.  The camera was introduced.  The tendon was identified.  The saphenous vein and sural nerve were not visualized.  Once were satisfied with our visibility, and confirmed that we are in the appropriate plane, a knife was introduced.  We fully released the tendon from  medial to lateral.  The camera was then introduced in the lateral portal, and the tendon was fully released from lateral to medial.  The ankle was dorsiflexed, and all remaining bands of tendon were released, to expose the underlying soleus musculature.  We gently dorsiflex and plantarflex the ankle, and confirmed that the tendon had been released at this level.  We evaluated the ankle critically.  The ankle was stable.  In addition, she was now able to dorsiflex the ankle at least 15 degrees, compared to just beyond neutral prior to the surgery.  Camera was removed.  Cannula was removed.  We irrigated the wound copiously.  We closed the incision in a multilayer fashion with absorbable suture.  Sterile dressing was placed, followed by a walking boot.  Patient was awoken taken to PACU in stable condition.   Post-operative plan:  The patient will be NWB on the operative extremity Discharge home from the PACU once they have recovered DVT prophylaxis Aspirin 81 mg twice daily for 6 weeks.    Pain control with PRN pain medication preferring oral medicines.   Follow up plan will be scheduled in approximately 10-14 days for incision check  and XR.

## 2023-05-15 NOTE — Transfer of Care (Signed)
Immediate Anesthesia Transfer of Care Note  Patient: Kim Mann  Procedure(s) Performed: LEFT ANKLE HARDWARE REMOVAL (Left: Ankle) LEFT GASTROCNEMIUS RECESSION (Left: Leg Lower)  Patient Location: PACU  Anesthesia Type:General  Level of Consciousness: awake  Airway & Oxygen Therapy: Patient Spontanous Breathing and Patient connected to face mask oxygen  Post-op Assessment: Report given to RN and Post -op Vital signs reviewed and stable  Post vital signs: Reviewed and stable  Last Vitals:  Vitals Value Taken Time  BP 160/62 05/15/23 0908  Temp    Pulse 99 05/15/23 0910  Resp 15 05/15/23 0910  SpO2 100 % 05/15/23 0910  Vitals shown include unfiled device data.  Last Pain:  Vitals:   05/15/23 0655  TempSrc: Oral  PainSc: 3       Patients Stated Pain Goal: 2 (05/15/23 1610)  Complications: No notable events documented.

## 2023-05-15 NOTE — Anesthesia Procedure Notes (Signed)
Procedure Name: LMA Insertion Date/Time: 05/15/2023 7:42 AM  Performed by: Julian Reil, CRNAPre-anesthesia Checklist: Patient identified, Emergency Drugs available, Suction available and Patient being monitored Patient Re-evaluated:Patient Re-evaluated prior to induction Oxygen Delivery Method: Circle system utilized Preoxygenation: Pre-oxygenation with 100% oxygen Induction Type: IV induction LMA: LMA with gastric port inserted LMA Size: 4.0 Tube type: Oral Number of attempts: 1 Placement Confirmation: positive ETCO2 Tube secured with: Tape Dental Injury: Teeth and Oropharynx as per pre-operative assessment

## 2023-05-15 NOTE — Interval H&P Note (Signed)
History and Physical Interval Note:  05/15/2023 7:18 AM  Kim Mann  has presented today for surgery, with the diagnosis of LEFT TRIMALLEOLAR ANKLE FRACTURE, PAINFUL ORTHROPEDIC HARDWARE, LEFT ACHILLES TENDON CONTRACTURE.  The various methods of treatment have been discussed with the patient and family. After consideration of risks, benefits and other options for treatment, the patient has consented to  Procedure(s) with comments: LEFT ANKLE HARDWARE REMOVAL (Left) - NEEDS RNFA LEFT GASTROCNEMIUS RECESSION (Left) - NEEDS RNFA as a surgical intervention.  The patient's history has been reviewed, patient examined, no change in status, stable for surgery.  I have reviewed the patient's chart and labs.  Questions were answered to the patient's satisfaction.    Removal of painful hardware from left ankle with grastroc recession to improve dorsiflexion of left ankle.    Oliver Barre

## 2023-05-15 NOTE — Telephone Encounter (Signed)
Dr. Dallas Schimke pt - pt lvm stating that she had surgery today and that she was able to urinate prior to leaving the hospital and that the nurse told her if she went four hours w/out urinating again to call our office for instructions.  [phone]

## 2023-05-15 NOTE — Anesthesia Preprocedure Evaluation (Signed)
Anesthesia Evaluation  Patient identified by MRN, date of birth, ID band Patient awake    Reviewed: Allergy & Precautions, H&P , NPO status , Patient's Chart, lab work & pertinent test results, reviewed documented beta blocker date and time   Airway Mallampati: II  TM Distance: >3 FB Neck ROM: full    Dental no notable dental hx.    Pulmonary neg pulmonary ROS, former smoker   Pulmonary exam normal breath sounds clear to auscultation       Cardiovascular Exercise Tolerance: Good hypertension, negative cardio ROS  Rhythm:regular Rate:Normal     Neuro/Psych  Neuromuscular disease negative neurological ROS  negative psych ROS   GI/Hepatic negative GI ROS, Neg liver ROS,GERD  ,,  Endo/Other  negative endocrine ROS    Renal/GU Renal diseasenegative Renal ROS  negative genitourinary   Musculoskeletal   Abdominal   Peds  Hematology negative hematology ROS (+)   Anesthesia Other Findings   Reproductive/Obstetrics negative OB ROS                             Anesthesia Physical Anesthesia Plan  ASA: 2  Anesthesia Plan: General and General LMA   Post-op Pain Management:    Induction:   PONV Risk Score and Plan: Ondansetron  Airway Management Planned:   Additional Equipment:   Intra-op Plan:   Post-operative Plan:   Informed Consent: I have reviewed the patients History and Physical, chart, labs and discussed the procedure including the risks, benefits and alternatives for the proposed anesthesia with the patient or authorized representative who has indicated his/her understanding and acceptance.     Dental Advisory Given  Plan Discussed with: CRNA  Anesthesia Plan Comments:        Anesthesia Quick Evaluation

## 2023-05-15 NOTE — Discharge Instructions (Addendum)
Mark A. Dallas Schimke, MD MS [facility] [address] [place],  [place]  [address] Phone: [phone] Fax: [phone]   POST-OPERATIVE INSTRUCTIONS - LOWER EXTREMITY   WOUND CARE Leave the dressing in place until your follow up appointment.  You may shower on Post-Op Day #3 but keep the dressing dry.  If the dressing becomes saturated, please remove the outer dressing and cover incisions with a dressing or band-aid  EXERCISES Due to your splint being in place you will not be able to bear weight through your extremity.   DO NOT PUT ANY WEIGHT ON YOUR OPERATIVE LEG Please use crutches or a walker to avoid weight bearing.   REGIONAL ANESTHESIA (NERVE BLOCKS) The anesthesia team may have performed a nerve block for you if safe in the setting of your care.  This is a great tool used to minimize pain.  Typically the block may start wearing off overnight but the long acting medicine may last for 3-4 days.  The nerve block wearing off can be a challenging period but please utilize your as needed pain medications to try and manage this period.    POST-OP MEDICATIONS- Multimodal approach to pain control  In general your pain will be controlled with a combination of substances.  Prescriptions unless otherwise discussed are electronically sent to your pharmacy.  This is a carefully made plan we use to minimize narcotic use.     - Diclofenac - Anti-inflammatory medication taken on a scheduled basis  - Hydrocodone - This is a strong narcotic, to be used only on an "as needed" basis for pain.  -  Aspirin 81mg  - This medicine is used to minimize the risk of blood clots after surgery.             -          Zofran - take as needed for nausea   FOLLOW-UP If you develop a Fever (>101.5), Redness or Drainage from the surgical incision site, please call our office to arrange for an evaluation. Please call the office to schedule a follow-up appointment for your incision check if  you do not already have one, 10-14 days post-operatively.  IF YOU HAVE ANY QUESTIONS, PLEASE FEEL FREE TO CALL OUR OFFICE.  HELPFUL INFORMATION  If you had a block, it will wear off between 8-24 hrs postop typically.  This is period when your pain may go from nearly zero to the pain you would have had postop without the block.  This is an abrupt transition but nothing dangerous is happening.  You may take an extra dose of narcotic when this happens.  You should wean off your narcotic medicines as soon as you are able.  Most patients will be off or using minimal narcotics before their first postop appointment.   Elevating your leg will help with swelling and pain control.  You are encouraged to elevate your leg as much as possible in the first couple of weeks following surgery.  Imagine a drop of water on your toe, and your goal is to get that water back to your heart.  We suggest you use the pain medication the first night prior to going to bed, in order to ease any pain when the anesthesia wears off. You should avoid taking pain medications on an empty stomach as it will make you nauseous.  Do not drink alcoholic beverages or take illicit drugs when taking pain medications.  In most states it is against the  law to drive while you are in a splint or sling.  And certainly against the law to drive while taking narcotics.  You may return to work/school in the next couple of days when you feel up to it.   Pain medication may make you constipated.  Below are a few solutions to try in this order: Decrease the amount of pain medication if you aren't having pain. Drink lots of decaffeinated fluids. Drink prune juice and/or each dried prunes  If the first 3 don't work start with additional solutions Take Colace - an over-the-counter stool softener Take Senokot - an over-the-counter laxative Take Miralax - a stronger over-the-counter laxative   The patient will be Non Weight Bearing on the  operative extremity Discharge home from the PACU once they have recovered DVT prophylaxis Aspirin 81 mg twice daily for 6 weeks.    Pain control with PRN pain medication preferring oral medicines.   Follow up plan will be scheduled in approximately 10-14 days for incision check and XR.

## 2023-05-16 ENCOUNTER — Telehealth: Payer: Self-pay

## 2023-05-16 NOTE — Telephone Encounter (Signed)
Called and left VM with information from Dr. Dallas Schimke, call back given incase of further questions or concerns.

## 2023-05-16 NOTE — Telephone Encounter (Signed)
Dr. Dallas Schimke Kim Mann - Kim Mann lvm stating she specifically wants to know if she is supposed to sleep in the boot or not.  [phone]

## 2023-05-16 NOTE — Telephone Encounter (Signed)
Spoke to pt and let her know she is to wear her boot to sleep.

## 2023-05-16 NOTE — Telephone Encounter (Signed)
 Patient called asking if she could get a return call about if she can take the boot off at night to sleep or not. She had surgery yesterday 05/15/23 Stated last time it was on her discharge paper, but not this time and she would like to find out.  Please call and advise    [phone]

## 2023-05-16 NOTE — Anesthesia Postprocedure Evaluation (Signed)
 Anesthesia Post Note  Patient: Kim Mann  Procedure(s) Performed: LEFT ANKLE HARDWARE REMOVAL (Left: Ankle) LEFT GASTROCNEMIUS RECESSION (Left: Leg Lower)  Patient location during evaluation: Phase II Anesthesia Type: General Level of consciousness: awake Pain management: pain level controlled Vital Signs Assessment: post-procedure vital signs reviewed and stable Respiratory status: spontaneous breathing and respiratory function stable Cardiovascular status: blood pressure returned to baseline and stable Postop Assessment: no headache and no apparent nausea or vomiting Anesthetic complications: no Comments: Late entry   No notable events documented.   Last Vitals:  Vitals:   05/15/23 1000 05/15/23 1030  BP: (!) 166/80 (!) 173/78  Pulse: 80 81  Resp: 15 16  Temp:  36.5 C  SpO2: 100% 95%    Last Pain:  Vitals:   05/15/23 1030  TempSrc: Oral  PainSc: 6                  Yvonna JINNY Bosworth

## 2023-05-18 ENCOUNTER — Encounter (HOSPITAL_COMMUNITY): Payer: Self-pay | Admitting: Orthopedic Surgery

## 2023-05-30 ENCOUNTER — Other Ambulatory Visit: Payer: Self-pay | Admitting: Orthopedic Surgery

## 2023-05-30 ENCOUNTER — Encounter: Payer: Self-pay | Admitting: Orthopedic Surgery

## 2023-05-30 ENCOUNTER — Ambulatory Visit: Payer: Medicare Other | Admitting: Orthopedic Surgery

## 2023-05-30 ENCOUNTER — Other Ambulatory Visit (INDEPENDENT_AMBULATORY_CARE_PROVIDER_SITE_OTHER): Payer: Self-pay

## 2023-05-30 DIAGNOSIS — S82852D Displaced trimalleolar fracture of left lower leg, subsequent encounter for closed fracture with routine healing: Secondary | ICD-10-CM

## 2023-05-30 DIAGNOSIS — M6702 Short Achilles tendon (acquired), left ankle: Secondary | ICD-10-CM

## 2023-05-30 DIAGNOSIS — T8484XA Pain due to internal orthopedic prosthetic devices, implants and grafts, initial encounter: Secondary | ICD-10-CM

## 2023-05-30 NOTE — Addendum Note (Signed)
 Addended by: Baird Kay on: 05/30/2023 02:26 PM   Modules accepted: Orders

## 2023-05-30 NOTE — Progress Notes (Signed)
 Orthopaedic Postop Note  Assessment: Kim Mann is a 73 y.o. female s/p ORIF of Left ankle fracture (DOS: 06/13/2022); removal of hardware from left ankle, and left endoscopic gastroc recession (DOS 05/15/2023)  Plan: Sutures removed.  Steri-Strips placed  Radiographs are stable [facility] for your left ankle Okay to start putting weight on your left leg, in the boot In 2 weeks, you can transition out of the boot Okay to initiate range of motion of the left ankle You do not need to wear the boot to bed Medications as needed Follow-up in 4 weeks    Follow-up: Return in about 4 weeks (around 06/27/2023). XR at next visit: Left ankle  Subjective:  Chief Complaint  Patient presents with   Foot Pain    L/ not much pain    History of Present Illness: Kim Mann is a 73 y.o. female who presents following the above stated procedure.  Most recent surgery was approximately 2 weeks ago.  She has done well following surgery.  She required pain medicine for couple of days.  She stopped taking diclofenac .  She has remained nonweightbearing.  She is worn the boot to bed, with the exception of last night.  No numbness or tingling.  Review of Systems: No fevers or chills No numbness or tingling No Chest Pain No shortness of breath   Objective: There were no vitals taken for this visit.  Physical Exam:  Alert and oriented.  No acute distress.    Seated in a wheelchair  Left foot and ankle with minimal swelling.  No bruising.  No redness.  Surgical incisions are healing well.  No surrounding erythema or drainage.  No tenderness within the distal gastrocnemius muscle.  She tolerates to dorsiflexion beyond neutral, but still has some residual tightness in the ankle.  Toes warm and well-perfused.  Sensation intact to the dorsum of the foot.  IMAGING: I personally ordered and reviewed the following images  X-rays of the left ankle were obtained in  clinic today.  These are compared available x-rays.  Interval removal of the hardware.  No acute injuries.  Mortise is congruent.  No syndesmotic disruption.  No bony lesions.  Screw tracks from prior plate fixation remain visible.  Impression: Left ankle x-rays in stable alignment, following hardware removal   Oneil DELENA Horde, MD 05/30/2023 12:01 PM

## 2023-05-30 NOTE — Patient Instructions (Addendum)
 [facility]erral [facility] for your left ankle  Okay to start putting weight on your left leg, in the boot  In 2 weeks, you can transition out of the boot  Okay to initiate range of motion of the left ankle  You do not need to wear the boot to bed  Medications as needed  Follow-up in 4 weeks

## 2023-06-01 ENCOUNTER — Encounter: Payer: Self-pay | Admitting: *Deleted

## 2023-06-27 ENCOUNTER — Encounter: Payer: Self-pay | Admitting: Orthopedic Surgery

## 2023-06-27 ENCOUNTER — Ambulatory Visit (INDEPENDENT_AMBULATORY_CARE_PROVIDER_SITE_OTHER): Payer: Medicare Other | Admitting: Orthopedic Surgery

## 2023-06-27 VITALS — Ht 67.0 in | Wt 153.0 lb

## 2023-06-27 DIAGNOSIS — S82852D Displaced trimalleolar fracture of left lower leg, subsequent encounter for closed fracture with routine healing: Secondary | ICD-10-CM

## 2023-06-27 DIAGNOSIS — T8484XA Pain due to internal orthopedic prosthetic devices, implants and grafts, initial encounter: Secondary | ICD-10-CM

## 2023-06-27 DIAGNOSIS — M6702 Short Achilles tendon (acquired), left ankle: Secondary | ICD-10-CM

## 2023-06-27 NOTE — Progress Notes (Signed)
Orthopaedic Postop Note  Assessment: Kim Mann is a 73 y.o. female s/p ORIF of Left ankle fracture (DOS: 06/13/2022); removal of hardware from left ankle, and left endoscopic gastroc recession (DOS 05/15/2023)  Plan: Kim Mann is doing very well.  Kim Mann has no pain following her most recent procedure.  He states that physical therapy is helping her.  Kim Mann is walking upwards of 1.5 miles per day.  I am very pleased with her progress.  No concerns on physical exam.  Anticipate that Kim Mann will continue to get stronger, and feel better overall.  He is comfortable returning to clinic as needed.   Follow-up: Return if symptoms worsen or fail to improve. XR at next visit: Left ankle  Subjective:  Chief Complaint  Patient presents with   Ankle Pain    Follow up left ankle     History of Present Illness: Kim Mann is a 73 y.o. female who presents following the above stated procedure.  Most recent surgery was approximately 6 weeks ago.  Kim Mann states that Kim Mann is doing much better.  Kim Mann is working with therapy.  He is not having any pain.  Kim Mann did not take any medicines for her ankle.  Kim Mann is up to walking 1.5 miles per day.  Her goal is to get back to walking 3 miles per day, which Kim Mann was doing prior to her injury.  Review of Systems: No fevers or chills No numbness or tingling No Chest Pain No shortness of breath   Objective: Ht 5\' 7"  (1.702 m)   Wt 153 lb (69.4 kg)   BMI 23.96 kg/m   Physical Exam:  Alert and oriented.  No acute distress.    Ambulating in a regular shoe.  Mild antalgic gait.  Surgical incisions have healed.  No surrounding erythema or drainage.  No tenderness to palpation.  Kim Mann has sensation intact over the dorsum of her foot.  Kim Mann tolerates dorsiflexion beyond 10 degrees, with mild discomfort.  Toes warm well-perfused.  IMAGING: I personally ordered and reviewed the following images  No new imaging was obtained today.  Oliver Barre,  MD 06/27/2023 11:45 AM

## 2023-06-30 MED ORDER — NA SULFATE-K SULFATE-MG SULF 17.5-3.13-1.6 GM/177ML PO SOLN: 1.0000 | ORAL | 0 refills | Status: DC

## 2023-08-01 ENCOUNTER — Encounter (HOSPITAL_COMMUNITY)
Admission: RE | Disposition: A | Discharge status: Home / Self Care | Payer: Self-pay | Source: Home / Self Care | Attending: Internal Medicine

## 2023-08-01 ENCOUNTER — Ambulatory Visit (HOSPITAL_COMMUNITY): Admitting: Anesthesiology

## 2023-08-01 ENCOUNTER — Encounter (HOSPITAL_COMMUNITY): Payer: Self-pay | Admitting: Internal Medicine

## 2023-08-01 ENCOUNTER — Other Ambulatory Visit: Payer: Self-pay

## 2023-08-01 ENCOUNTER — Ambulatory Visit (HOSPITAL_COMMUNITY)
Admission: RE | Admit: 2023-08-01 | Discharge: 2023-08-01 | Disposition: A | Discharge status: Home / Self Care | Payer: Medicare Other | Attending: Internal Medicine | Admitting: Internal Medicine

## 2023-08-01 DIAGNOSIS — K648 Other hemorrhoids: Secondary | ICD-10-CM | POA: Diagnosis not present

## 2023-08-01 DIAGNOSIS — K2950 Unspecified chronic gastritis without bleeding: Secondary | ICD-10-CM | POA: Insufficient documentation

## 2023-08-01 DIAGNOSIS — Z1211 Encounter for screening for malignant neoplasm of colon: Secondary | ICD-10-CM

## 2023-08-01 DIAGNOSIS — Z87891 Personal history of nicotine dependence: Secondary | ICD-10-CM | POA: Insufficient documentation

## 2023-08-01 DIAGNOSIS — K449 Diaphragmatic hernia without obstruction or gangrene: Secondary | ICD-10-CM | POA: Insufficient documentation

## 2023-08-01 DIAGNOSIS — K222 Esophageal obstruction: Secondary | ICD-10-CM

## 2023-08-01 DIAGNOSIS — R1310 Dysphagia, unspecified: Secondary | ICD-10-CM | POA: Insufficient documentation

## 2023-08-01 DIAGNOSIS — K219 Gastro-esophageal reflux disease without esophagitis: Secondary | ICD-10-CM | POA: Diagnosis not present

## 2023-08-01 DIAGNOSIS — Z800 Family history of malignant neoplasm of digestive organs: Secondary | ICD-10-CM | POA: Diagnosis not present

## 2023-08-01 DIAGNOSIS — K5730 Diverticulosis of large intestine without perforation or abscess without bleeding: Secondary | ICD-10-CM | POA: Diagnosis not present

## 2023-08-01 DIAGNOSIS — D128 Benign neoplasm of rectum: Secondary | ICD-10-CM

## 2023-08-01 SURGERY — COLONOSCOPY WITH PROPOFOL
Anesthesia: General

## 2023-08-01 MED ORDER — SODIUM CHLORIDE 0.9% FLUSH: 3.0000 mL | Freq: Two times a day (BID) | INTRAVENOUS | Status: DC

## 2023-08-01 MED ORDER — LACTATED RINGERS IV SOLN: INTRAVENOUS | Status: DC | PRN

## 2023-08-01 MED ORDER — PHENYLEPHRINE 80 MCG/ML (10ML) SYRINGE FOR IV PUSH (FOR BLOOD PRESSURE SUPPORT)
PREFILLED_SYRINGE | INTRAVENOUS | Status: DC | PRN
  Administered 2023-08-01: 80 ug via INTRAVENOUS

## 2023-08-01 MED ORDER — LIDOCAINE HCL (CARDIAC) PF 100 MG/5ML IV SOSY
PREFILLED_SYRINGE | INTRAVENOUS | Status: DC | PRN
  Administered 2023-08-01: 80 mg via INTRAVENOUS

## 2023-08-01 MED ORDER — PROPOFOL 10 MG/ML IV BOLUS
INTRAVENOUS | Status: DC | PRN
  Administered 2023-08-01: 80 mg via INTRAVENOUS
  Administered 2023-08-01: 150 ug/kg/min via INTRAVENOUS

## 2023-08-01 MED ORDER — SODIUM CHLORIDE 0.9% FLUSH: 3.0000 mL | INTRAVENOUS | Status: DC | PRN

## 2023-08-01 NOTE — Transfer of Care (Signed)
 Immediate Anesthesia Transfer of Care Note  Patient: Kim Mann  Procedure(s) Performed: COLONOSCOPY WITH PROPOFOL ESOPHAGOGASTRODUODENOSCOPY (EGD) WITH PROPOFOL BALLOON DILATION POLYPECTOMY  Patient Location: PACU  Anesthesia Type:General  Level of Consciousness: awake, alert , and patient cooperative  Airway & Oxygen Therapy: Patient Spontanous Breathing  Post-op Assessment: Report given to RN, Post -op Vital signs reviewed and stable, and Patient moving all extremities X 4  Post vital signs: Reviewed and stable  Last Vitals:  Vitals Value Taken Time  BP 105/48 08/01/23 1031  Temp 36.4 C 08/01/23 1031  Pulse 87 08/01/23 1031  Resp 13 08/01/23 1031  SpO2 97 % 08/01/23 1031    Last Pain:  Vitals:   08/01/23 1031  TempSrc: Oral  PainSc: 4       Patients Stated Pain Goal: 8 (08/01/23 0854)  Complications: No notable events documented.

## 2023-08-01 NOTE — Anesthesia Postprocedure Evaluation (Signed)
 Anesthesia Post Note  Patient: Kim Mann  Procedure(s) Performed: COLONOSCOPY WITH PROPOFOL ESOPHAGOGASTRODUODENOSCOPY (EGD) WITH PROPOFOL BALLOON DILATION POLYPECTOMY  Patient location during evaluation: PACU Anesthesia Type: General Level of consciousness: awake and alert Pain management: pain level controlled Vital Signs Assessment: post-procedure vital signs reviewed and stable Respiratory status: spontaneous breathing, nonlabored ventilation, respiratory function stable and patient connected to nasal cannula oxygen Cardiovascular status: blood pressure returned to baseline and stable Postop Assessment: no apparent nausea or vomiting Anesthetic complications: no   There were no known notable events for this encounter.   Last Vitals:  Vitals:   08/01/23 0854 08/01/23 1031  BP: (!) 166/85 (!) 105/48  Pulse: 79 87  Resp: 16 13  Temp: 36.6 C 36.4 C  SpO2: 99% 97%    Last Pain:  Vitals:   08/01/23 1035  TempSrc:   PainSc: 0-No pain                 Dvontae Ruan L Hanae Waiters

## 2023-08-01 NOTE — H&P (Signed)
 Primary Care Physician:  Alvina Filbert, MD Primary Gastroenterologist:  Dr. Marletta Lor  Pre-Procedure History & Physical: HPI:  Kim Mann is a 73 y.o. female is here for an EGD with possible dilation due to history of dysphagia, GERD and colonoscopy for surveillance purposes, personal history of adenomatous colon polyps 2022 and family history of colon cancer in brother and sister.   Past Medical History:  Diagnosis Date   Chronic constipation    GERD (gastroesophageal reflux disease)    History of Helicobacter pylori infection 10/2013   per EGD w/ bx,  treated   Pre-diabetes    Prolapse of posterior vaginal wall     Past Surgical History:  Procedure Laterality Date   ADJUSTABLE SUTURE MANIPULATION Right 02/19/2016   Procedure: ADJUSTABLE SUTURE MANIPULATION;  Surgeon: Verne Carrow, MD;  Location: [facility];  Service: Ophthalmology;  Laterality: Right;   ANTERIOR AND POSTERIOR REPAIR WITH SACROSPINOUS FIXATION N/A 01/11/2021   Procedure: SACROSPINOUS LIGAMENT FIXATION;  Surgeon: Marguerita Beards, MD;  Location: [facility];  Service: Gynecology;  Laterality: N/A;   CATARACT EXTRACTION W/ INTRAOCULAR LENS IMPLANT Bilateral 2017   COLONOSCOPY N/A 04/04/2017   Procedure: COLONOSCOPY;  Surgeon: West Bali, MD;  [facility];  Service: Endoscopy;  Laterality: N/A;  12:45pm - LM for pt to arrive at 12:15   COLONOSCOPY WITH PROPOFOL N/A 05/25/2020   Internal hemorrhoids, many small and large-mouthed diverticula in sigmoid, descending, and transverse colon. 3 mm polyp in sigmoid. 10 mm polyp in transverse. 3 year surveillance. Sessile serrated polyps.    CYSTOCELE REPAIR  03/25/2003   @[facility] ;  Anterior Colporrhaphy   ESOPHAGOGASTRODUODENOSCOPY  10/2013   GASTROCNEMIUS RECESSION Left 05/15/2023   Procedure: LEFT GASTROCNEMIUS RECESSION;  Surgeon: Oliver Barre, MD;  Location: [facility];  Service: Orthopedics;  Laterality: Left;  NEEDS RNFA    HARDWARE REMOVAL Left 05/15/2023   Procedure: LEFT ANKLE HARDWARE REMOVAL;  Surgeon: Oliver Barre, MD;  Location: [facility];  Service: Orthopedics;  Laterality: Left;  NEEDS RNFA   ORIF ANKLE FRACTURE Left 06/13/2022   Procedure: OPEN REDUCTION INTERNAL FIXATION (ORIF) ANKLE FRACTURE;  Surgeon: Oliver Barre, MD;  Location: [facility];  Service: Orthopedics;  Laterality: Left;   POLYPECTOMY  05/25/2020   Procedure: POLYPECTOMY;  Surgeon: Lanelle Bal, DO;  [facility];  Service: Endoscopy;;   RECTOCELE REPAIR N/A 01/11/2021   Procedure: POSTERIOR REPAIR (RECTOCELE) with perineorrhaphy;  Surgeon: Marguerita Beards, MD;  Location: [facility];  Service: Gynecology;  Laterality: N/A;   STRABISMUS SURGERY Right 02/19/2016   Procedure: REPAIR STRABISMUS;  Surgeon: Verne Carrow, MD;  Location: [facility];  Service: Ophthalmology;  Laterality: Right;   STRABISMUS SURGERY Right    age 58 and 39   TOTAL ABDOMINAL HYSTERECTOMY W/ BILATERAL SALPINGOOPHORECTOMY  02/15/1999   @[facility]  (operative record in care everywhere)  ;  Culdoplasty /  Burch Cystourethropexy/  Posterior Colporrhaphy    Prior to Admission medications   Medication Sig Start Date End Date Taking? Authorizing Provider  Biotin 1000 MCG tablet Take 1,000 mcg by mouth daily.   Yes [provider]  Cholecalciferol (VITAMIN D3) 50 MCG (2000 UT) TABS Take 2,000 Units by mouth daily.   Yes [provider]  estradiol (ESTRACE) 0.1 MG/GM vaginal cream Place 0.5g nightly for two weeks then twice a week after Patient taking differently: Place 1 Applicatorful vaginally once a week. 09/15/22  Yes Marguerita Beards, MD  Na Sulfate-K Sulfate-Mg Sulfate concentrate 17.5-3.13-1.6 GM/177ML SOLN Take 1 kit by mouth as directed. 06/30/23  Yes [employer] K, DO  pantoprazole (PROTONIX) 40 MG tablet Take 1 tablet (40 mg total) by mouth 2 (two) times daily. 05/04/23 05/03/24 Yes Jaileen Janelle, Hennie Duos, DO  Polyethyl Glycol-Propyl Glycol (SYSTANE) 0.4-0.3 % SOLN Place 1 drop into both eyes daily as needed (Dry eye).   Yes [provider]  polyethylene glycol (MIRALAX / GLYCOLAX) 17 g packet Take 17 g by mouth every other day.   Yes [provider]  HYDROcodone-acetaminophen (NORCO/VICODIN) 5-325 MG tablet Take 1 tablet by mouth every 4 (four) hours as needed for moderate pain (pain score 4-6). Patient not taking: Reported on 06/27/2023 05/15/23 05/14/24  Oliver Barre, MD  tretinoin (RETIN-A) 0.01 % gel Apply 1 application  topically every other day. At bedtime 11/11/22   [provider]    Allergies as of 06/30/2023 - Review Complete 06/27/2023  Allergen Reaction Noted   Latex Rash 04/29/2020   Sulfa antibiotics Rash 02/15/2016    Family History  Problem Relation Age of Onset   Brain cancer Mother 57   Heart failure Father 56   Colon cancer Sister 33   Colon polyps Neg Hx     Social History   Socioeconomic History   Marital status: Married    Spouse name: Not on file   Number of children: Not on file   Years of education: Not on file   Highest education level: Not on file  Occupational History   Not on file  Tobacco Use   Smoking status: Former    Current packs/day: 0.00    Types: Cigarettes    Start date: 3    Quit date: 1980    Years since quitting: 45.2   Smokeless tobacco: Never  Vaping Use   Vaping status: Never Used  Substance and Sexual Activity   Alcohol use: No   Drug use: No   Sexual activity: Not Currently    Birth control/protection: Surgical  Other Topics Concern   Not on file  Social History Narrative   2 KIDS: AGE 36 AND 35. 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   HAS SIX SIBLINGS(3 SIS, 2 BRO)   Social Drivers of Corporate investment banker Strain: Not on file  Food Insecurity: Not on file  Transportation Needs: Not on file  Physical Activity: Not on file   Stress: Not on file  Social Connections: Unknown (09/27/2021)   Received from [facility], [facility]   Social Network    Social Network: Not on file  Intimate Partner Violence: Unknown (08/19/2021)   Received from [facility], [facility]   HITS    Physically Hurt: Not on file    Insult or Talk Down To: Not on file    Threaten Physical Harm: Not on file    Scream or Curse: Not on file    Review of Systems: General: Negative for fever, chills, fatigue, weakness. Eyes: Negative for vision changes.  ENT: Negative for hoarseness, difficulty swallowing , nasal congestion. CV: Negative for chest pain, angina, palpitations, dyspnea on exertion, peripheral edema.  Respiratory: Negative for dyspnea at rest, dyspnea on exertion, cough, sputum, wheezing.  GI: See history of present illness. GU:  Negative for dysuria, hematuria, urinary incontinence, urinary frequency, nocturnal urination.  MS: Negative for joint pain, low back pain.  Derm: Negative for rash or itching.  Neuro:  Negative for weakness, abnormal sensation, seizure, frequent headaches, memory loss, confusion.  Psych: Negative for anxiety, depression Endo: Negative for unusual weight change.  Heme: Negative for bruising or bleeding. Allergy: Negative for rash or hives.  Physical Exam: Vital signs in last 24 hours: Temp:  [97.9 F (36.6 C)] 97.9 F (36.6 C) (03/18 0854) Pulse Rate:  [79] 79 (03/18 0854) Resp:  [16] 16 (03/18 0854) BP: (166)/(85) 166/85 (03/18 0854) SpO2:  [99 %] 99 % (03/18 0854) Weight:  [70.8 kg] 70.8 kg (03/18 0854)   General:   Alert,  Well-developed, well-nourished, pleasant and cooperative in NAD Head:  Normocephalic and atraumatic. Eyes:  Sclera clear, no icterus.   Conjunctiva pink. Ears:  Normal auditory acuity. Nose:  No deformity, discharge,  or lesions. Msk:  Symmetrical without gross deformities. Normal posture. Extremities:  Without clubbing or edema. Neurologic:  Alert and   oriented x4;  grossly normal neurologically. Skin:  Intact without significant lesions or rashes. Psych:  Alert and cooperative. Normal mood and affect.   Impression/Plan: Kim Mann is here for an EGD with possible dilation due to history of dysphagia, GERD and colonoscopy for surveillance purposes, personal history of adenomatous colon polyps 2022 and family history of colon cancer in brother and sister.   Risks, benefits, limitations, imponderables and alternatives regarding procedure have been reviewed with the patient. Questions have been answered. All parties agreeable.

## 2023-08-01 NOTE — Discharge Instructions (Addendum)
 EGD Discharge instructions Please read the instructions outlined below and refer to this sheet in the next few weeks. These discharge instructions provide you with general information on caring for yourself after you leave the hospital. Your doctor may also give you specific instructions. While your treatment has been planned according to the most current medical practices available, unavoidable complications occasionally occur. If you have any problems or questions after discharge, please call your doctor. ACTIVITY You may resume your regular activity but move at a slower pace for the next 24 hours.  Take frequent rest periods for the next 24 hours.  Walking will help expel (get rid of) the air and reduce the bloated feeling in your abdomen.  No driving for 24 hours (because of the anesthesia (medicine) used during the test).  You may shower.  Do not sign any important legal documents or operate any machinery for 24 hours (because of the anesthesia used during the test).  NUTRITION Drink plenty of fluids.  You may resume your normal diet.  Begin with a light meal and progress to your normal diet.  Avoid alcoholic beverages for 24 hours or as instructed by your caregiver.  MEDICATIONS You may resume your normal medications unless your caregiver tells you otherwise.  WHAT YOU CAN EXPECT TODAY You may experience abdominal discomfort such as a feeling of fullness or "gas" pains.  FOLLOW-UP Your doctor will discuss the results of your test with you.  SEEK IMMEDIATE MEDICAL ATTENTION IF ANY OF THE FOLLOWING OCCUR: Excessive nausea (feeling sick to your stomach) and/or vomiting.  Severe abdominal pain and distention (swelling).  Trouble swallowing.  Temperature over 101 F (37.8 C).  Rectal bleeding or vomiting of blood.      Colonoscopy Discharge Instructions  Read the instructions outlined below and refer to this sheet in the next few weeks. These discharge instructions provide you  with general information on caring for yourself after you leave the hospital. Your doctor may also give you specific instructions. While your treatment has been planned according to the most current medical practices available, unavoidable complications occasionally occur.   ACTIVITY You may resume your regular activity, but move at a slower pace for the next 24 hours.  Take frequent rest periods for the next 24 hours.  Walking will help get rid of the air and reduce the bloated feeling in your belly (abdomen).  No driving for 24 hours (because of the medicine (anesthesia) used during the test).   Do not sign any important legal documents or operate any machinery for 24 hours (because of the anesthesia used during the test).  NUTRITION Drink plenty of fluids.  You may resume your normal diet as instructed by your doctor.  Begin with a light meal and progress to your normal diet. Heavy or fried foods are harder to digest and may make you feel sick to your stomach (nauseated).  Avoid alcoholic beverages for 24 hours or as instructed.  MEDICATIONS You may resume your normal medications unless your doctor tells you otherwise.  WHAT YOU CAN EXPECT TODAY Some feelings of bloating in the abdomen.  Passage of more gas than usual.  Spotting of blood in your stool or on the toilet paper.  IF YOU HAD POLYPS REMOVED DURING THE COLONOSCOPY: No aspirin products for 7 days or as instructed.  No alcohol for 7 days or as instructed.  Eat a soft diet for the next 24 hours.  FINDING OUT THE RESULTS OF YOUR TEST Not all test results  are available during your visit. If your test results are not back during the visit, make an appointment with your caregiver to find out the results. Do not assume everything is normal if you have not heard from your caregiver or the medical facility. It is important for you to follow up on all of your test results.  SEEK IMMEDIATE MEDICAL ATTENTION IF: You have more than a  spotting of blood in your stool.  Your belly is swollen (abdominal distention).  You are nauseated or vomiting.  You have a temperature over 101.  You have abdominal pain or discomfort that is severe or gets worse throughout the day.   Your EGD revealed mild amount inflammation in your stomach.  I took biopsies of this to rule out infection with a bacteria called H. pylori.  Await pathology results, my office will contact you.  Small hiatal hernia noted.  Mild Schatzki's ring as well which I stretched out today.  Small bowel appeared normal.  Continue on pantoprazole.  Your colonoscopy revealed 1 polyp(s) which I removed successfully. Await pathology results, my office will contact you. I recommend repeating colonoscopy in  years for surveillance purposes and family history of colon cancer  You also have diverticulosis and internal hemorrhoids. I would recommend increasing fiber in your diet or adding OTC Benefiber/Metamucil. Be sure to drink at least 4 to 6 glasses of water daily. Follow-up with GI in 3 months   I hope you have a great rest of your week!  Hennie Duos. Marletta Lor, D.O. Gastroenterology and Hepatology [facility]

## 2023-08-01 NOTE — Op Note (Signed)
 [facility] Patient Name: Kim Mann Procedure Date: 08/01/2023 9:49 AM MRN: [id number] Date of Birth: Sep 03, 1950 Attending MD: Hennie Duos. Marletta Lor , [place], [id number] CSN: [id number] Age: 73 Admit Type: Outpatient Procedure:                Upper GI endoscopy Indications:              Dysphagia, Heartburn Providers:                Hennie Duos. Marletta Lor, DO, Sheran Fava, Elinor Parkinson Referring MD:              Medicines:                See the Anesthesia note for documentation of the                            administered medications Complications:            No immediate complications. Estimated Blood Loss:     Estimated blood loss was minimal. Procedure:                Pre-Anesthesia Assessment:                           - The anesthesia plan was to use monitored                            anesthesia care (MAC).                           After obtaining informed consent, the endoscope was                            passed under direct vision. Throughout the                            procedure, the patient's blood pressure, pulse, and                            oxygen saturations were monitored continuously. The                            GIF-H190 ([id number]) scope was introduced through the                            mouth, and advanced to the second part of duodenum.                            The upper GI endoscopy was accomplished without                            difficulty. The patient tolerated the procedure                            well. Scope In: 10:01:14 AM Scope Out:  10:05:36 AM Total Procedure Duration: 0 hours 4 minutes 22 seconds  Findings:      A small hiatal hernia was present.      A mild Schatzki ring was found in the distal esophagus. A TTS dilator       was passed through the scope. Dilation with an 18-19-20 mm balloon       dilator was performed to 20 mm. The dilation site was examined and       showed moderate  improvement in luminal narrowing.      Patchy minimal inflammation characterized by erythema was found in the       gastric body. Biopsies were taken with a cold forceps for Helicobacter       pylori testing.      The duodenal bulb, first portion of the duodenum and second portion of       the duodenum were normal. Impression:               - Small hiatal hernia.                           - Mild Schatzki ring. Dilated.                           - Gastritis. Biopsied.                           - Normal duodenal bulb, first portion of the                            duodenum and second portion of the duodenum. Moderate Sedation:      Per Anesthesia Care Recommendation:           - Patient has a contact number available for                            emergencies. The signs and symptoms of potential                            delayed complications were discussed with the                            patient. Return to normal activities tomorrow.                            Written discharge instructions were provided to the                            patient.                           - Resume previous diet.                           - Continue present medications.                           - Await pathology results.                           -  Repeat upper endoscopy PRN for retreatment.                           - Return to GI clinic in 3 months. Procedure Code(s):        --- Professional ---                           [phone], Esophagogastroduodenoscopy, flexible,                            transoral; with transendoscopic balloon dilation of                            esophagus (less than 30 mm diameter)                           [id number], 59, Esophagogastroduodenoscopy, flexible,                            transoral; with biopsy, single or multiple Diagnosis Code(s):        --- Professional ---                           K44.9, Diaphragmatic hernia without obstruction or                             gangrene                           K22.2, Esophageal obstruction                           K29.70, Gastritis, unspecified, without bleeding                           R13.10, Dysphagia, unspecified                           R12, Heartburn CPT copyright 2022 American Medical Association. All rights reserved. The codes documented in this report are preliminary and upon coder review may  be revised to meet current compliance requirements. Hennie Duos. Marletta Lor, DO Hennie Duos. Marletta Lor, DO 08/01/2023 10:08:09 AM This report has been signed electronically. Number of Addenda: 0

## 2023-08-01 NOTE — Op Note (Signed)
 [facility] Patient Name: Kim Mann Procedure Date: 08/01/2023 9:49 AM MRN: [id number] Date of Birth: 07/24/1950 Attending MD: Hennie Duos. Maple Mirza, [id number] CSN: [id number] Age: 73 Admit Type: Outpatient Procedure:                Colonoscopy Indications:              Colon cancer screening in patient at increased                            risk: Colorectal cancer in brother, Colon cancer                            screening in patient at increased risk: Colorectal                            cancer in sister, High risk colon cancer                            surveillance: Personal history of adenoma (10 mm or                            greater in size) Providers:                Hennie Duos. Marletta Lor, DO, Sheran Fava, Elinor Parkinson Referring MD:              Medicines:                See the Anesthesia note for documentation of the                            administered medications Complications:            No immediate complications. Estimated Blood Loss:     Estimated blood loss was minimal. Procedure:                Pre-Anesthesia Assessment:                           - The anesthesia plan was to use monitored                            anesthesia care (MAC).                           After obtaining informed consent, the colonoscope                            was passed under direct vision. Throughout the                            procedure, the patient's blood pressure, pulse, and                            oxygen saturations were monitored continuously. The  [id number]-HQ190L ([id number]) scope was introduced through                            the anus and advanced to the the cecum, identified                            by appendiceal orifice and ileocecal valve. The                            colonoscopy was performed without difficulty. The                            patient tolerated the procedure well. The  quality                            of the bowel preparation was evaluated using the                            BBPS [facility] Bowel Preparation Scale) with scores                            of: Right Colon = 3, Transverse Colon = 3 and Left                            Colon = 3 (entire mucosa seen well with no residual                            staining, small fragments of stool or opaque                            liquid). The total BBPS score equals 9. Scope In: 10:10:36 AM Scope Out: 10:26:24 AM Scope Withdrawal Time: 0 hours 12 minutes 0 seconds  Total Procedure Duration: 0 hours 15 minutes 48 seconds  Findings:      Non-bleeding internal hemorrhoids were found during endoscopy.      Many large-mouthed and small-mouthed diverticula were found in the       sigmoid colon, descending colon and transverse colon.      A 4 mm polyp was found in the rectum. The polyp was sessile. The polyp       was removed with a cold snare. Resection and retrieval were complete.      The exam was otherwise without abnormality. Impression:               - Non-bleeding internal hemorrhoids.                           - Diverticulosis in the sigmoid colon, in the                            descending colon and in the transverse colon.                           - One 4 mm polyp in the rectum, removed with a cold  snare. Resected and retrieved.                           - The examination was otherwise normal. Moderate Sedation:      Per Anesthesia Care Recommendation:           - Patient has a contact number available for                            emergencies. The signs and symptoms of potential                            delayed complications were discussed with the                            patient. Return to normal activities tomorrow.                            Written discharge instructions were provided to the                            patient.                           -  Resume previous diet.                           - Continue present medications.                           - Await pathology results.                           - Repeat colonoscopy in 5 years for surveillance                            and family history of colon cancer                           - Return to GI clinic in 3 months. Procedure Code(s):        --- Professional ---                           [phone], Colonoscopy, flexible; with removal of                            tumor(s), polyp(s), or other lesion(s) by snare                            technique Diagnosis Code(s):        --- Professional ---                           Z80.0, Family history of malignant neoplasm of                            digestive organs  Z86.010, Personal history of colonic polyps                           K64.8, Other hemorrhoids                           D12.8, Benign neoplasm of rectum                           K57.30, Diverticulosis of large intestine without                            perforation or abscess without bleeding CPT copyright 2022 American Medical Association. All rights reserved. The codes documented in this report are preliminary and upon coder review may  be revised to meet current compliance requirements. Hennie Duos. Marletta Lor, DO Hennie Duos. Marletta Lor, DO 08/01/2023 10:30:54 AM This report has been signed electronically. Number of Addenda: 0

## 2023-08-01 NOTE — Anesthesia Preprocedure Evaluation (Signed)
 Anesthesia Evaluation  Patient identified by MRN, date of birth, ID band Patient awake    Reviewed: Allergy & Precautions, H&P , NPO status , Patient's Chart, lab work & pertinent test results, reviewed documented beta blocker date and time   History of Anesthesia Complications Negative for: history of anesthetic complications  Airway Mallampati: II  TM Distance: >3 FB Neck ROM: Full    Dental  (+) Dental Advisory Given, Caps,    Pulmonary neg pulmonary ROS, former smoker   Pulmonary exam normal breath sounds clear to auscultation       Cardiovascular Exercise Tolerance: Good negative cardio ROS Normal cardiovascular exam Rhythm:Regular Rate:Normal     Neuro/Psych  Neuromuscular disease negative neurological ROS  negative psych ROS   GI/Hepatic Neg liver ROS,GERD  Medicated and Controlled,,  Endo/Other  Pre diabetes  Renal/GU negative Renal ROS  negative genitourinary   Musculoskeletal negative musculoskeletal ROS (+)    Abdominal   Peds  Hematology negative hematology ROS (+)   Anesthesia Other Findings   Reproductive/Obstetrics negative OB ROS                             Anesthesia Physical Anesthesia Plan  ASA: 2  Anesthesia Plan: General   Post-op Pain Management: Minimal or no pain anticipated   Induction: Intravenous  PONV Risk Score and Plan: Propofol infusion  Airway Management Planned: Natural Airway and Nasal Cannula  Additional Equipment: None  Intra-op Plan:   Post-operative Plan:   Informed Consent: I have reviewed the patients History and Physical, chart, labs and discussed the procedure including the risks, benefits and alternatives for the proposed anesthesia with the patient or authorized representative who has indicated his/her understanding and acceptance.     Dental advisory given  Plan Discussed with: CRNA  Anesthesia Plan Comments:          Anesthesia Quick Evaluation

## 2023-08-02 ENCOUNTER — Encounter (HOSPITAL_COMMUNITY): Payer: Self-pay | Admitting: Internal Medicine

## 2023-08-02 LAB — SURGICAL PATHOLOGY

## 2024-02-24 ENCOUNTER — Other Ambulatory Visit: Payer: Self-pay

## 2024-02-24 ENCOUNTER — Emergency Department (HOSPITAL_COMMUNITY)
Admission: EM | Admit: 2024-02-24 | Discharge: 2024-02-24 | Disposition: A | Discharge status: Home / Self Care | Attending: Emergency Medicine | Admitting: Emergency Medicine

## 2024-02-24 ENCOUNTER — Emergency Department (HOSPITAL_COMMUNITY)

## 2024-02-24 ENCOUNTER — Encounter (HOSPITAL_COMMUNITY): Payer: Self-pay

## 2024-02-24 DIAGNOSIS — Z91040 Latex allergy status: Secondary | ICD-10-CM | POA: Diagnosis not present

## 2024-02-24 DIAGNOSIS — R059 Cough, unspecified: Secondary | ICD-10-CM | POA: Diagnosis present

## 2024-02-24 DIAGNOSIS — N3001 Acute cystitis with hematuria: Secondary | ICD-10-CM | POA: Diagnosis not present

## 2024-02-24 DIAGNOSIS — J189 Pneumonia, unspecified organism: Secondary | ICD-10-CM | POA: Diagnosis not present

## 2024-02-24 LAB — CBC WITH DIFFERENTIAL/PLATELET
Abs Immature Granulocytes: 0.28 K/uL — ABNORMAL HIGH (ref 0.00–0.07)
Basophils Absolute: 0.1 K/uL (ref 0.0–0.1)
Basophils Relative: 0 %
Eosinophils Absolute: 0.5 K/uL (ref 0.0–0.5)
Eosinophils Relative: 2 %
HCT: 39.5 % (ref 36.0–46.0)
Hemoglobin: 13.6 g/dL (ref 12.0–15.0)
Immature Granulocytes: 1 %
Lymphocytes Relative: 6 %
Lymphs Abs: 1.1 K/uL (ref 0.7–4.0)
MCH: 30.6 pg (ref 26.0–34.0)
MCHC: 34.4 g/dL (ref 30.0–36.0)
MCV: 89 fL (ref 80.0–100.0)
Monocytes Absolute: 1.3 K/uL — ABNORMAL HIGH (ref 0.1–1.0)
Monocytes Relative: 7 %
Neutro Abs: 16.7 K/uL — ABNORMAL HIGH (ref 1.7–7.7)
Neutrophils Relative %: 84 %
Platelets: 294 K/uL (ref 150–400)
RBC: 4.44 MIL/uL (ref 3.87–5.11)
RDW: 12.2 % (ref 11.5–15.5)
WBC: 19.9 K/uL — ABNORMAL HIGH (ref 4.0–10.5)
nRBC: 0 % (ref 0.0–0.2)

## 2024-02-24 LAB — COMPREHENSIVE METABOLIC PANEL WITH GFR
ALT: 12 U/L (ref 0–44)
AST: 15 U/L (ref 15–41)
Albumin: 4.3 g/dL (ref 3.5–5.0)
Alkaline Phosphatase: 108 U/L (ref 38–126)
Anion gap: 14 (ref 5–15)
BUN: 13 mg/dL (ref 8–23)
CO2: 25 mmol/L (ref 22–32)
Calcium: 9.7 mg/dL (ref 8.9–10.3)
Chloride: 94 mmol/L — ABNORMAL LOW (ref 98–111)
Creatinine, Ser: 0.64 mg/dL (ref 0.44–1.00)
GFR, Estimated: >60 mL/min (ref >60)
Glucose, Bld: 132 mg/dL — ABNORMAL HIGH (ref 70–99)
Potassium: 3.4 mmol/L — ABNORMAL LOW (ref 3.5–5.1)
Sodium: 132 mmol/L — ABNORMAL LOW (ref 135–145)
Total Bilirubin: 1 mg/dL (ref 0.0–1.2)
Total Protein: 8.2 g/dL — ABNORMAL HIGH (ref 6.5–8.1)

## 2024-02-24 LAB — URINALYSIS, W/ REFLEX TO CULTURE (INFECTION SUSPECTED)
Bilirubin Urine: NEGATIVE
Glucose, UA: NEGATIVE mg/dL
Ketones, ur: 20 mg/dL — AB
Nitrite: NEGATIVE
Protein, ur: 30 mg/dL — AB
Specific Gravity, Urine: 1.015 (ref 1.005–1.030)
pH: 6 (ref 5.0–8.0)

## 2024-02-24 LAB — LACTIC ACID, PLASMA: Lactic Acid, Venous: 1.2 mmol/L (ref 0.5–1.9)

## 2024-02-24 LAB — RESP PANEL BY RT-PCR (RSV, FLU A&B, COVID)  RVPGX2
Influenza A by PCR: NEGATIVE
Influenza B by PCR: NEGATIVE
Resp Syncytial Virus by PCR: NEGATIVE
SARS Coronavirus 2 by RT PCR: NEGATIVE

## 2024-02-24 MED ORDER — LACTATED RINGERS IV BOLUS (SEPSIS)
1000.0000 mL | Freq: Once | INTRAVENOUS | Status: AC
  Administered 2024-02-24: 1000 mL via INTRAVENOUS

## 2024-02-24 MED ORDER — ALBUTEROL SULFATE HFA 108 (90 BASE) MCG/ACT IN AERS
2.0000 | INHALATION_SPRAY | Freq: Once | RESPIRATORY_TRACT | Status: AC
  Administered 2024-02-24: 2 via RESPIRATORY_TRACT
  Filled 2024-02-24: qty 6.7

## 2024-02-24 MED ORDER — AZITHROMYCIN 250 MG PO TABS
500.0000 mg | ORAL_TABLET | Freq: Once | ORAL | Status: AC
Start: 2024-02-24 — End: 2024-02-24
  Administered 2024-02-24: 500 mg via ORAL
  Filled 2024-02-24: qty 2

## 2024-02-24 MED ORDER — ACETAMINOPHEN 500 MG PO TABS
1000.0000 mg | ORAL_TABLET | Freq: Once | ORAL | Status: AC
Start: 2024-02-24 — End: 2024-02-24
  Administered 2024-02-24: 1000 mg via ORAL
  Filled 2024-02-24: qty 2

## 2024-02-24 MED ORDER — SODIUM CHLORIDE 0.9 % IV SOLN
1.0000 g | Freq: Once | INTRAVENOUS | Status: AC
  Administered 2024-02-24: 1 g via INTRAVENOUS
  Filled 2024-02-24: qty 10

## 2024-02-24 MED ORDER — AEROCHAMBER PLUS FLO-VU MEDIUM MISC
1.0000 | Freq: Once | Status: AC
  Administered 2024-02-24: 1

## 2024-02-24 MED ORDER — CEFDINIR 300 MG PO CAPS: 300.0000 mg | ORAL_CAPSULE | Freq: Two times a day (BID) | ORAL | 0 refills | Status: AC

## 2024-02-24 MED ORDER — POTASSIUM CHLORIDE CRYS ER 20 MEQ PO TBCR
20.0000 meq | EXTENDED_RELEASE_TABLET | Freq: Once | ORAL | Status: AC
  Administered 2024-02-24: 20 meq via ORAL
  Filled 2024-02-24: qty 1

## 2024-02-24 MED ORDER — AZITHROMYCIN 250 MG PO TABS: 250.0000 mg | ORAL_TABLET | Freq: Every day | ORAL | 0 refills | Status: AC

## 2024-02-24 MED ORDER — KETOROLAC TROMETHAMINE 15 MG/ML IJ SOLN
15.0000 mg | Freq: Once | INTRAMUSCULAR | Status: AC
  Administered 2024-02-24: 15 mg via INTRAVENOUS
  Filled 2024-02-24: qty 1

## 2024-02-24 MED ORDER — KETOROLAC TROMETHAMINE 15 MG/ML IJ SOLN: 15.0000 mg | Freq: Once | INTRAMUSCULAR | Status: DC

## 2024-02-24 NOTE — ED Notes (Signed)
 Pt/family received d/c paperwork at this time. After going over the paperwork any questions, comments, or concerns were answered to the best of this nurse's knowledge. The pt/family verbally acknowledged the teachings/instructions.

## 2024-02-24 NOTE — ED Provider Notes (Signed)
 [place] EMERGENCY DEPARTMENT AT [facility] Provider Note   CSN: [id number] Arrival date & time: 02/24/24  1146     Patient presents with: Cough   Kim Mann is a 73 y.o. female.  She reports history of GERD.  Presents to the ER today for productive cough, congestion, body aches, chills that started 2 days ago.  She denies any chest pain.  She states she has been drinking a lot of water , reports dysuria and urinary frequency and dark-colored urine.  She denies any abdominal or flank pain.  She does report she is having increased reflux that she relates to her eating less than usual.  Reports she has had a bowel movement in 3 days.  She is able to pass gas.  She comes in today due to continuing to feel unwell.  She lives at home with her husband.    Cough      Prior to Admission medications   Medication Sig Start Date End Date Taking? Authorizing Provider  azithromycin (ZITHROMAX) 250 MG tablet Take 1 tablet (250 mg total) by mouth daily. 02/25/24  Yes Lara Palinkas A, PA-C  cefdinir (OMNICEF) 300 MG capsule Take 1 capsule (300 mg total) by mouth 2 (two) times daily for 5 days. 02/25/24 03/01/24 Yes Cassandria Drew A, PA-C  Biotin 1000 MCG tablet Take 1,000 mcg by mouth daily.    [provider]  Cholecalciferol (VITAMIN D3) 50 MCG (2000 UT) TABS Take 2,000 Units by mouth daily.    [provider]  estradiol  (ESTRACE ) 0.1 MG/GM vaginal cream Place 0.5g nightly for two weeks then twice a week after Patient taking differently: Place 1 Applicatorful vaginally once a week. 09/15/22   Marilynne Rosaline SAILOR, MD  HYDROcodone -acetaminophen  (NORCO/VICODIN) 5-325 MG tablet Take 1 tablet by mouth every 4 (four) hours as needed for moderate pain (pain score 4-6). Patient not taking: Reported on 06/27/2023 05/15/23 05/14/24  Onesimo Oneil LABOR, MD  pantoprazole  (PROTONIX ) 40 MG tablet Take 1 tablet (40 mg total) by mouth 2 (two) times daily. 05/04/23 05/03/24   Cindie Carlin POUR, DO  Polyethyl Glycol-Propyl Glycol (SYSTANE) 0.4-0.3 % SOLN Place 1 drop into both eyes daily as needed (Dry eye).    [provider]  polyethylene glycol (MIRALAX / GLYCOLAX) 17 g packet Take 17 g by mouth every other day.    [provider]  tretinoin (RETIN-A) 0.01 % gel Apply 1 application  topically every other day. At bedtime 11/11/22   [provider]    Allergies: Latex and Sulfa antibiotics    Review of Systems  Respiratory:  Positive for cough.     Updated Vital Signs BP 108/66   Pulse 79   Temp 98.1 F (36.7 C) (Oral)   Resp 18   Ht 5' 6.5 (1.689 m)   Wt 68.9 kg   SpO2 95%   BMI 24.17 kg/m   Physical Exam Vitals and nursing note reviewed.  Constitutional:      General: She is not in acute distress.    Appearance: She is well-developed.  HENT:     Head: Normocephalic and atraumatic.     Mouth/Throat:     Mouth: Mucous membranes are moist.  Eyes:     Extraocular Movements: Extraocular movements intact.     Conjunctiva/sclera: Conjunctivae normal.     Pupils: Pupils are equal, round, and reactive to light.  Cardiovascular:     Rate and Rhythm: Normal rate and regular rhythm.     Heart  sounds: No murmur heard. Pulmonary:     Effort: Pulmonary effort is normal. No respiratory distress.     Breath sounds: Normal breath sounds.  Abdominal:     General: There is no distension.     Palpations: Abdomen is soft.     Tenderness: There is no abdominal tenderness. There is no guarding or rebound.  Musculoskeletal:        General: No swelling.     Cervical back: Neck supple.  Skin:    General: Skin is warm and dry.     Capillary Refill: Capillary refill takes less than 2 seconds.  Neurological:     General: No focal deficit present.     Mental Status: She is alert and oriented to person, place, and time.  Psychiatric:        Mood and Affect: Mood normal.        Behavior: Behavior normal.     (all labs ordered are  listed, but only abnormal results are displayed) Labs Reviewed  COMPREHENSIVE METABOLIC PANEL WITH GFR - Abnormal; Notable for the following components:      Result Value   Sodium 132 (*)    Potassium 3.4 (*)    Chloride 94 (*)    Glucose, Bld 132 (*)    Total Protein 8.2 (*)    All other components within normal limits  CBC WITH DIFFERENTIAL/PLATELET - Abnormal; Notable for the following components:   WBC 19.9 (*)    Neutro Abs 16.7 (*)    Monocytes Absolute 1.3 (*)    Abs Immature Granulocytes 0.28 (*)    All other components within normal limits  URINALYSIS, W/ REFLEX TO CULTURE (INFECTION SUSPECTED) - Abnormal; Notable for the following components:   APPearance HAZY (*)    Hgb urine dipstick SMALL (*)    Ketones, ur 20 (*)    Protein, ur 30 (*)    Leukocytes,Ua MODERATE (*)    Bacteria, UA RARE (*)    Non Squamous Epithelial 0-5 (*)    All other components within normal limits  RESP PANEL BY RT-PCR (RSV, FLU A&B, COVID)  RVPGX2  CULTURE, BLOOD (ROUTINE X 2)  CULTURE, BLOOD (ROUTINE X 2)  URINE CULTURE  LACTIC ACID, PLASMA    EKG: EKG Interpretation Date/Time:  Saturday February 24 2024 13:14:57 EDT Ventricular Rate:  115 PR Interval:  153 QRS Duration:  107 QT Interval:  313 QTC Calculation: 433 R Axis:   18  Text Interpretation: Sinus tachycardia Abnormal R-wave progression, early transition No acute changes No old tracing to compare Confirmed by Charlyn Sora ([id number]) on 02/24/2024 2:50:57 PM  Radiology: [facility] Chest Port 1 View Result Date: 02/24/2024 CLINICAL DATA:  Sepsis, shortness of breath, body aches, and abdominal pain. EXAM: PORTABLE CHEST 1 VIEW COMPARISON:  None Available. FINDINGS: The heart size and mediastinal contours are within normal limits. Interstitial prominence is present bilaterally. No effusion or pneumothorax is seen. Degenerative changes are present in the thoracic spine. No acute osseous abnormality. IMPRESSION: Interstitial prominence  bilaterally, possible edema, infiltrate, or chronic changes. Electronically Signed   By: Leita Birmingham M.D.   On: 02/24/2024 14:10     Procedures   Medications Ordered in the ED  lactated ringers  bolus 1,000 mL (0 mLs Intravenous Stopped 02/24/24 1529)  acetaminophen  (TYLENOL ) tablet 1,000 mg (1,000 mg Oral Given 02/24/24 1329)  cefTRIAXone (ROCEPHIN) 1 g in sodium chloride  0.9 % 100 mL IVPB (0 g Intravenous Stopped 02/24/24 1529)  azithromycin (ZITHROMAX) tablet 500 mg (500  mg Oral Given 02/24/24 1518)  ketorolac  (TORADOL ) 15 MG/ML injection 15 mg (15 mg Intravenous Given 02/24/24 1517)  albuterol (VENTOLIN HFA) 108 (90 Base) MCG/ACT inhaler 2 puff (2 puffs Inhalation Given 02/24/24 1633)  AeroChamber Plus Flo-Vu Medium MISC 1 each (1 each Other Given 02/24/24 1633)  potassium chloride SA (KLOR-CON M) CR tablet 20 mEq (20 mEq Oral Given 02/24/24 1629)                                    Medical Decision Making This patient presents to the ED for concern of bodyaches, chills, congestion, cough and dysuria, this involves an extensive number of treatment options, and is a complaint that carries with it a high risk of complications and morbidity.  The differential diagnosis includes pneumonia, UTI, sepsis, electrolyte derangement, other   Co morbidities that complicate the patient evaluation :   GERD   Additional history obtained:  Additional history obtained from EMR External records from outside source obtained and reviewed including previous notes, prior labs   Lab Tests:  I Ordered, and personally interpreted labs.  The pertinent results include: Not has significant leukocytosis white blood cell count 19.9, UA shows moderate leukocytes, 16 red blood cells, 21-50 white blood cells, rare bacteria with 11-20 squamous epithelial cells, lactic acid is normal, CMP with mild hyponatremia mild hypokalemia.   Imaging Studies ordered:  I ordered imaging studies including chest x-ray  which shows bilateral interstitial prominence without consolidation. I independently visualized and interpreted imaging within scope of identifying emergent findings  I agree with the radiologist interpretation   Cardiac Monitoring: / EKG:  The patient was maintained on a cardiac monitor.  I personally viewed and interpreted the cardiac monitored which showed an underlying rhythm of: Sinus rhythm   Consultations Obtained:  Patient also seen with ED attending Dr. Nausea vomiting   Problem List / ED Course / Critical interventions / Medication management  Well-appearing otherwise healthy 73 year old female coming in with several days of cough, congestion, body aches and chills and also noted to have dysuria and frequency.  She has reassuring exam, no CVA tenderness, no abdominal tenderness, lungs are clear to auscultation bilaterally.  She is reporting cough with some mild shortness of breath.  Upon arrival she had fever and mild tachycardia with negative COVID flu RSV testing, initiated further workup to evaluate for possible sepsis.  She does have a leukocytosis but vitals normalized after IV fluids and Tylenol  and she is feeling much better, lactic acid was normal, well I do believe she has an infection and we will treat for both pneumonia and UTI, I do not feel she is to stay in hospital, she is able to ambulate and oxygen saturation stayed approximately 94% and patient was observed walking and did not appear to be dyspneic.  She is agreeable to plan of care but we discussed tricked return precautions.  And discussed need for follow-up with PCP. I ordered medication including Tylenol  and Toradol  for body aches, chills and fever Reevaluation of the patient after these medicines showed that the patient improved I have reviewed the patients home medicines and have made adjustments as needed   Social Determinants of Health:  Lives independently    Amount and/or Complexity of Data  Reviewed Labs: ordered. Radiology: ordered.  Risk OTC drugs. Prescription drug management.        Final diagnoses:  Community acquired pneumonia, unspecified laterality  Acute cystitis with hematuria    ED Discharge Orders          Ordered    azithromycin (ZITHROMAX) 250 MG tablet  Daily        02/24/24 1614    cefdinir (OMNICEF) 300 MG capsule  2 times daily        02/24/24 1614    Check Pulse Oximetry while ambulating        02/24/24 [id number] PA-C 02/24/24 1904    Charlyn Sora, MD 02/27/24 2154

## 2024-02-24 NOTE — ED Triage Notes (Signed)
 Pt to er, pt states I am sicker than I have ever been in my whole life pt states that she is sob, had body aches and abd pain.  Pt states that she has been feeling poorly since Thursday.

## 2024-02-24 NOTE — ED Notes (Signed)
 Lab was only able to get one blue bottle of culture. Pt refused to be stuck again.

## 2024-02-24 NOTE — Discharge Instructions (Addendum)
 There was a pleasure taking care of you today.  You were evaluated for feeling generally unwell, he had a fever, cough, congestion and also having burning with urination.  Your symptoms along with your chest x-ray are consistent with likely pneumonia and we are treating you with antibiotics.  You also do have a UTI and we are treating with antibiotics for this as well.  We also provided you with an albuterol inhaler here and a spacer.  Please use this as directed every 6 hours as needed for cough or shortness of breath.  Your blood work showed normal kidney function, high white blood cell count which is consistent with infection.  Your potassium was slightly low so we have given you potassium here.  Make sure to continue to drink plenty of fluids, come back to the ER if you have any new or worsening symptoms such as increased shortness of breath, dizziness or passing out, chest pain, persistent fevers or any other worsening symptoms.  You take over-the-counter Tylenol  and ibuprofen  as needed for pain or fever.  Follow-up close with your primary care doctor.

## 2024-02-24 NOTE — ED Notes (Signed)
 Changed acuity due to the pt needing a Septic workup

## 2024-02-24 NOTE — ED Notes (Signed)
 Ambulation trial completed at this time. Pt's O2 maintained from 91-93% with HR of 94. Provider aware

## 2024-02-26 LAB — URINE CULTURE: Culture: >=100000 — AB

## 2024-02-29 LAB — CULTURE, BLOOD (ROUTINE X 2)
Culture: NO GROWTH
Culture: NO GROWTH
Special Requests: ADEQUATE

## 2024-04-30 ENCOUNTER — Encounter: Payer: Self-pay | Admitting: Diagnostic Neuroimaging

## 2024-04-30 ENCOUNTER — Ambulatory Visit: Admitting: Diagnostic Neuroimaging

## 2024-05-19 ENCOUNTER — Other Ambulatory Visit: Payer: Self-pay | Admitting: Internal Medicine

## 2024-05-20 ENCOUNTER — Other Ambulatory Visit: Payer: Self-pay | Admitting: Internal Medicine

## 2024-05-20 NOTE — Telephone Encounter (Signed)
Pt needs appt before further refills

## 2024-06-13 NOTE — Telephone Encounter (Signed)
 noted

## 2024-06-14 ENCOUNTER — Telehealth: Payer: Self-pay | Admitting: Pharmacy Technician

## 2024-06-14 ENCOUNTER — Other Ambulatory Visit (HOSPITAL_COMMUNITY)
Admission: RE | Admit: 2024-06-14 | Discharge: 2024-06-14 | Disposition: A | Source: Ambulatory Visit | Attending: Obstetrics and Gynecology | Admitting: Obstetrics and Gynecology

## 2024-06-14 ENCOUNTER — Encounter: Payer: Self-pay | Admitting: Obstetrics and Gynecology

## 2024-06-14 ENCOUNTER — Ambulatory Visit: Admitting: Obstetrics and Gynecology

## 2024-06-14 ENCOUNTER — Other Ambulatory Visit (HOSPITAL_COMMUNITY): Payer: Self-pay

## 2024-06-14 VITALS — BP 132/73 | HR 85

## 2024-06-14 DIAGNOSIS — N898 Other specified noninflammatory disorders of vagina: Secondary | ICD-10-CM

## 2024-06-14 DIAGNOSIS — M62838 Other muscle spasm: Secondary | ICD-10-CM | POA: Insufficient documentation

## 2024-06-14 MED ORDER — CYCLOBENZAPRINE HCL 5 MG PO TABS: 5.0000 mg | ORAL_TABLET | Freq: Every evening | ORAL | 0 refills | Status: AC | PRN

## 2024-06-14 NOTE — Assessment & Plan Note (Signed)
-   Suspect stress/ muscle spasm may be contributing to some pelvic floor dysfunction in the morning. No significant prolapse noted.  - Will trial flexeril  at night - Also recommended diaphragmatic breathing exercises, which can be used when she is on the toilet to help with pelvic floor relaxation. Handouts provided.

## 2024-06-14 NOTE — Telephone Encounter (Signed)
 Pharmacy Patient Advocate Encounter   Received notification from [facility] that prior authorization for Cyclobenzaprine  HCl 5MG  tablets is required/requested.   Insurance verification completed.   The patient is insured through [employer].   Per test claim: PA required; PA submitted to above mentioned insurance via Latent Key/confirmation #/EOC [facility] Status is pending

## 2024-06-14 NOTE — Progress Notes (Signed)
 [facility] Return Visit  SUBJECTIVE  History of Present Illness: Kim Mann is a 74 y.o. female seen in follow-up.   Sometimes has difficulty urinating when she wakes up in the morning- this happened 2 times. She does feels some pressure at the opening. Was checked by PCP in January and and had a negative culture. She has burning on the vulva and at the onset of urination. She is using the estradiol  cream but just got a new prescription so had not been using it for a while. She is using astroglyde daily for moisture.   She is drinking tea twice a day - 10am and sometimes around 3. Otherwise has 3-4 bottles of water . Sometimes has urgency. Does not have urinary leakage.   s/p posterior repair, perineorrhaphy and sacrospinous ligament fixation on 01/11/21. Not feeling any recurrent bulge symptoms.   Reports a lot of personal stress lately with her taking care of her sister and husband's health.   Past Medical History: Patient  has a past medical history of Chronic constipation, GERD (gastroesophageal reflux disease), History of Helicobacter pylori infection (10/2013), Pre-diabetes, and Prolapse of posterior vaginal wall.   Past Surgical History: She  has a past surgical history that includes Cataract extraction w/ intraocular lens implant (Bilateral, 2017); Strabismus surgery (Right, 02/19/2016); Adjustable suture manipulation (Right, 02/19/2016); Colonoscopy (N/A, 04/04/2017); Colonoscopy with propofol  (N/A, 05/25/2020); polypectomy (05/25/2020); Total abdominal hysterectomy w/ bilateral salpingoophorectomy (02/15/1999); Cystocele repair (03/25/2003); Strabismus surgery (Right); Esophagogastroduodenoscopy (10/2013); Rectocele repair (N/A, 01/11/2021); Anterior and posterior repair with sacrospinous fixation (N/A, 01/11/2021); ORIF ankle fracture (Left, 06/13/2022); Hardware Removal (Left, 05/15/2023); Gastrocnemius Recession (Left, 05/15/2023); Colonoscopy with propofol  (N/A,  08/01/2023); Esophagogastroduodenoscopy (egd) with propofol  (N/A, 08/01/2023); Balloon dilation (N/A, 08/01/2023); and polypectomy (08/01/2023).   Medications: She has a current medication list which includes the following prescription(s): azithromycin , biotin, vitamin d3, cyclobenzaprine , estradiol , pantoprazole , systane, polyethylene glycol, and tretinoin.   Allergies: Patient is allergic to latex and sulfa antibiotics.   Social History: Patient  reports that she quit smoking about 46 years ago. Her smoking use included cigarettes. She started smoking about 61 years ago. She has never used smokeless tobacco. She reports that she does not drink alcohol and does not use drugs.     OBJECTIVE     Physical Exam: Vitals:   06/14/24 1115  BP: 132/73  Pulse: 85   Gen: No apparent distress, A&O x 3.  Detailed Urogynecologic Evaluation:  Normal external genitalia. On speculum, normal vaginal mucosa. On bimanual, no masses present. Aptima obtained.  POP-Q  -2.5                                            Aa   -2.5                                           Ba  -5.5                                              C   3.5  Gh  5.5                                            Pb  7                                            tvl   -3                                            Ap  -3                                            Bp                                                 D  Palpated pelvic floor muscles, slightly increased tension but not tender to palpation    ASSESSMENT AND PLAN    Ms. Schussler is a 74 y.o. with:  1. Muscle spasm   2. Vaginal discharge     Muscle spasm Assessment & Plan: - Suspect stress/ muscle spasm may be contributing to some pelvic floor dysfunction in the morning. No significant prolapse noted.  - Will trial flexeril  at night - Also recommended diaphragmatic breathing exercises, which can be used when she is on  the toilet to help with pelvic floor relaxation. Handouts provided.   Orders: -     Cyclobenzaprine  HCl; Take 1 tablet (5 mg total) by mouth at bedtime as needed for muscle spasms.  Dispense: 60 tablet; Refill: 0  Vaginal discharge -     Cervicovaginal ancillary only  Return 2 months   Rosaline LOISE Caper, MD

## 2024-06-14 NOTE — Patient Instructions (Signed)
 Continue estrogen cream 2-3 times per week Prescribed flexeril  5mg  to use as needed for muscle tension. Can consider pelvic physical therapy as well Practice belly breathing (diaphragmatic breathing).

## 2024-06-17 ENCOUNTER — Other Ambulatory Visit (HOSPITAL_COMMUNITY): Payer: Self-pay

## 2024-06-17 LAB — CERVICOVAGINAL ANCILLARY ONLY
Bacterial Vaginitis (gardnerella): NEGATIVE
Candida Glabrata: NEGATIVE
Candida Vaginitis: NEGATIVE
Comment: NEGATIVE
Comment: NEGATIVE
Comment: NEGATIVE

## 2024-06-17 NOTE — Telephone Encounter (Signed)
 Pharmacy Patient Advocate Encounter  Received notification from [facility] that Prior Authorization for Cyclobenzaprine  HCl 5MG  tablets has been APPROVED from 05/31/2024 to 05/15/2098. Unable to obtain price due to refill too soon rejection, last fill date 06/15/2024 next available fill date03/16/2026.   PA #/Case ID/Reference #: [id number]

## 2024-08-20 ENCOUNTER — Ambulatory Visit: Admitting: Obstetrics and Gynecology

## 2024-09-12 ENCOUNTER — Ambulatory Visit: Admitting: Gastroenterology
